# Patient Record
Sex: Male | Born: 1950 | Race: White | Hispanic: No | Marital: Married | Smoking: Never smoker
Health system: Southern US, Community
[De-identification: ages and names within clinical notes are randomized; demographics above are authoritative.]

## PROBLEM LIST (undated history)

## (undated) DIAGNOSIS — C8460 Anaplastic large cell lymphoma, ALK-positive, unspecified site: Secondary | ICD-10-CM

## (undated) DIAGNOSIS — IMO0001 Reserved for inherently not codable concepts without codable children: Secondary | ICD-10-CM

## (undated) DIAGNOSIS — I10 Essential (primary) hypertension: Secondary | ICD-10-CM

## (undated) DIAGNOSIS — N200 Calculus of kidney: Secondary | ICD-10-CM

## (undated) DIAGNOSIS — C8462 Anaplastic large cell lymphoma, ALK-positive, intrathoracic lymph nodes: Principal | ICD-10-CM

## (undated) DIAGNOSIS — K311 Adult hypertrophic pyloric stenosis: Secondary | ICD-10-CM

## (undated) DIAGNOSIS — I4891 Unspecified atrial fibrillation: Secondary | ICD-10-CM

## (undated) DIAGNOSIS — IMO0002 Reserved for concepts with insufficient information to code with codable children: Secondary | ICD-10-CM

## (undated) HISTORY — DX: Reserved for concepts with insufficient information to code with codable children: IMO0002

## (undated) HISTORY — DX: Anaplastic large cell lymphoma, ALK-positive, intrathoracic lymph nodes: C84.62

## (undated) HISTORY — PX: OTHER SURGICAL HISTORY: SHX169

## (undated) HISTORY — PX: HERNIA REPAIR: SHX51

## (undated) HISTORY — DX: Reserved for inherently not codable concepts without codable children: IMO0001

## (undated) HISTORY — PX: CYSTOSCOPY: SHX5120

---

## 2008-04-18 ENCOUNTER — Emergency Department (HOSPITAL_BASED_OUTPATIENT_CLINIC_OR_DEPARTMENT_OTHER): Admission: EM | Admit: 2008-04-18 | Discharge: 2008-04-18 | Payer: Self-pay | Admitting: Emergency Medicine

## 2008-04-18 ENCOUNTER — Ambulatory Visit: Payer: Self-pay | Admitting: Diagnostic Radiology

## 2010-07-28 LAB — DIFFERENTIAL
Basophils Absolute: 0 10*3/uL (ref 0.0–0.1)
Lymphocytes Relative: 28 % (ref 12–46)
Lymphs Abs: 2.1 10*3/uL (ref 0.7–4.0)
Monocytes Absolute: 0.4 10*3/uL (ref 0.1–1.0)
Neutro Abs: 5 10*3/uL (ref 1.7–7.7)

## 2010-07-28 LAB — BASIC METABOLIC PANEL
Calcium: 9.5 mg/dL (ref 8.4–10.5)
GFR calc Af Amer: >60 mL/min (ref >60)
GFR calc non Af Amer: >60 mL/min (ref >60)
Sodium: 136 mEq/L (ref 135–145)

## 2010-07-28 LAB — POCT CARDIAC MARKERS: Myoglobin, poc: 43.9 ng/mL (ref 12–200)

## 2010-07-28 LAB — CBC
Hemoglobin: 16.4 g/dL (ref 13.0–17.0)
RBC: 5.26 MIL/uL (ref 4.22–5.81)
RDW: 12.1 % (ref 11.5–15.5)
WBC: 7.7 10*3/uL (ref 4.0–10.5)

## 2011-01-20 ENCOUNTER — Emergency Department (HOSPITAL_BASED_OUTPATIENT_CLINIC_OR_DEPARTMENT_OTHER)
Admission: EM | Admit: 2011-01-20 | Discharge: 2011-01-20 | Disposition: A | Discharge status: Home / Self Care | Payer: PRIVATE HEALTH INSURANCE | Attending: Emergency Medicine | Admitting: Emergency Medicine

## 2011-01-20 ENCOUNTER — Other Ambulatory Visit: Payer: Self-pay

## 2011-01-20 ENCOUNTER — Encounter: Payer: Self-pay | Admitting: Emergency Medicine

## 2011-01-20 DIAGNOSIS — I10 Essential (primary) hypertension: Secondary | ICD-10-CM | POA: Insufficient documentation

## 2011-01-20 DIAGNOSIS — I4891 Unspecified atrial fibrillation: Secondary | ICD-10-CM | POA: Insufficient documentation

## 2011-01-20 DIAGNOSIS — R0789 Other chest pain: Secondary | ICD-10-CM

## 2011-01-20 DIAGNOSIS — Z79899 Other long term (current) drug therapy: Secondary | ICD-10-CM | POA: Insufficient documentation

## 2011-01-20 HISTORY — DX: Unspecified atrial fibrillation: I48.91

## 2011-01-20 HISTORY — DX: Essential (primary) hypertension: I10

## 2011-01-20 HISTORY — DX: Adult hypertrophic pyloric stenosis: K31.1

## 2011-01-20 HISTORY — DX: Calculus of kidney: N20.0

## 2011-01-20 LAB — DIFFERENTIAL
Eosinophils Relative: 1 % (ref 0–5)
Lymphocytes Relative: 24 % (ref 12–46)
Lymphs Abs: 2 10*3/uL (ref 0.7–4.0)
Monocytes Absolute: 0.4 10*3/uL (ref 0.1–1.0)

## 2011-01-20 LAB — CBC
HCT: 46.7 % (ref 39.0–52.0)
MCV: 85.5 fL (ref 78.0–100.0)
RBC: 5.46 MIL/uL (ref 4.22–5.81)
RDW: 11.9 % (ref 11.5–15.5)
WBC: 8.4 10*3/uL (ref 4.0–10.5)

## 2011-01-20 LAB — BASIC METABOLIC PANEL
BUN: 12 mg/dL (ref 6–23)
CO2: 22 mEq/L (ref 19–32)
Calcium: 9.7 mg/dL (ref 8.4–10.5)
Creatinine, Ser: 0.9 mg/dL (ref 0.50–1.35)
Glucose, Bld: 100 mg/dL — ABNORMAL HIGH (ref 70–99)

## 2011-01-20 MED ORDER — SODIUM CHLORIDE 0.9 % IJ SOLN
3.0000 mL | INTRAMUSCULAR | Status: DC | PRN
  Filled 2011-01-20: qty 3

## 2011-01-20 MED ORDER — OMEPRAZOLE 20 MG PO CPDR: 40.0000 mg | DELAYED_RELEASE_CAPSULE | Freq: Every day | ORAL | Status: DC

## 2011-01-20 NOTE — ED Provider Notes (Signed)
History     CSN: [id number] Arrival date & time: 01/20/2011  7:38 PM  Chief Complaint  Patient presents with  . Hypertension    (Consider location/radiation/quality/duration/timing/severity/associated sxs/prior treatment) HPI Several weeks of intermittent belching, which occurs multiple times per hour for about an hour at a time then symptom free last several hours to a couple days without doing it, his belching is nonexertional and has no chest pain palpitation shortness of breath or other concerns other than his blood pressure mildly elevated higher than usual today. Today he feels some generalized fatigue and generalized weakness but has no pain no lightheadedness no vertigo no change in speech, vision, swallowing, or understanding, and no lateralizing weakness or incoordination or numbness. Past Medical History  Diagnosis Date  . Hypertension   . Atrial fibrillation   . Pyloric stenosis   . Kidney stones     Past Surgical History  Procedure Date  . Cystoscopy   . Pyloric stenosis repair     age 60 yrs    History reviewed. No pertinent family history.  History  Substance Use Topics  . Smoking status: Never Smoker   . Smokeless tobacco: Not on file  . Alcohol Use: 0.6 oz/week    1 Glasses of wine per week      Review of Systems  Constitutional: Positive for fatigue. Negative for fever.       10 Systems reviewed and are negative for acute change except as noted in the HPI.  HENT: Negative for congestion.   Eyes: Negative for discharge and redness.  Respiratory: Negative for cough and shortness of breath.   Cardiovascular: Negative for chest pain.  Gastrointestinal: Negative for vomiting and abdominal pain.  Musculoskeletal: Negative for back pain.  Skin: Negative for rash.  Neurological: Negative for syncope, numbness and headaches.  Psychiatric/Behavioral:       No behavior change.    Allergies  Sulfa drugs cross reactors  Home Medications   Current  Outpatient Rx  Name Route Sig Dispense Refill  . ASPIRIN EC 81 MG PO TBEC Oral Take 81 mg by mouth daily.     . ATENOLOL 25 MG PO TABS Oral Take 12.5 mg by mouth daily.     Marland Kitchen VITAMIN D 1000 UNITS PO TABS Oral Take 2,000 Units by mouth daily.     . OMEGA-3 FATTY ACIDS 1000 MG PO CAPS Oral Take 2 g by mouth daily.     Marland Kitchen GLUCOSAMINE HCL PO Oral Take 1 tablet by mouth daily.     Marland Kitchen MILK THISTLE PO Oral Take 1 tablet by mouth daily.     Marland Kitchen NIACIN CR 500 MG PO TBCR Oral Take 500 mg by mouth daily.     Marland Kitchen NIFEDIPINE ER OSMOTIC 30 MG PO TB24 Oral Take 30 mg by mouth daily.     . CHOLEST OFF PO Oral Take 1 tablet by mouth daily.     . RED YEAST RICE PO Oral Take 2 tablets by mouth daily.     . TESTOSTERONE ENANTHATE 200 MG/ML IM OIL Intramuscular Inject into the muscle every 14 (fourteen) days. Last taken on 9th of October 2012    . OMEPRAZOLE 20 MG PO CPDR Oral Take 40 mg by mouth daily.        BP 145/87  Pulse 70  Temp(Src) 98 F (36.7 C) (Oral)  Resp 12  SpO2 99%  Physical Exam  Nursing note and vitals reviewed. Constitutional:       Awake, alert, nontoxic  appearance.  HENT:  Head: Atraumatic.  Eyes: Right eye exhibits no discharge. Left eye exhibits no discharge.  Neck: Neck supple.  Cardiovascular: Normal rate and regular rhythm.   No murmur heard. Pulmonary/Chest: Effort normal and breath sounds normal. No respiratory distress. He has no wheezes. He has no rales. He exhibits no tenderness.  Abdominal: Soft. Bowel sounds are normal. There is no tenderness. There is no rebound.  Musculoskeletal: He exhibits no edema and no tenderness.       Baseline ROM, no obvious new focal weakness.  Neurological:       Mental status and motor strength appears baseline for patient and situation.  Skin: No rash noted.  Psychiatric: He has a normal mood and affect.    ED Course  Procedures (including critical care time) ECG normal sinus rhythm with sinus arrhythmia, ventricular rate 75 beats per  minute, normal axis, normal intervals, septal Q waves are present, nonspecific ST changes present, compared with January 2010 atrial fibrillation is no longer present.: Labs Reviewed  CBC - Abnormal; Notable for the following:    MCHC 36.4 (*)    All other components within normal limits  BASIC METABOLIC PANEL - Abnormal; Notable for the following:    Sodium 134 (*)    Glucose, Bld 100 (*)    All other components within normal limits  DIFFERENTIAL  TROPONIN I   Dg Chest Portable 1 View  01/27/2011  *RADIOLOGY REPORT*  Clinical Data: Chest pain with tachycardia and shortness of breath. History of hypertension and atrial fibrillation.  PORTABLE CHEST - 1 VIEW  Comparison: 04/18/2008.  Findings: 0933 hours. The heart size and mediastinal contours are normal. The lungs are clear. There is no pleural effusion or pneumothorax. No acute osseous findings are identified.  Telemetry leads overlie the chest.  IMPRESSION: No active pulmonary process.  Original Report Authenticated By: Gerrianne Scale, M.D.     1. Chest pain, atypical     The patient remained stable in the emergency Department he understands and agrees with his assessment and plan as well.  He has not had his symptoms while in the emergency department of belching.  MDM          Hurman Horn, MD 01/27/11 2255

## 2011-01-20 NOTE — ED Notes (Signed)
Pt c/o "jsut not feeling well" with "indigestion today and blood pressure increasing through out the day. Pt has hx of afib.

## 2011-01-27 ENCOUNTER — Other Ambulatory Visit: Payer: Self-pay

## 2011-01-27 ENCOUNTER — Encounter (HOSPITAL_BASED_OUTPATIENT_CLINIC_OR_DEPARTMENT_OTHER): Payer: Self-pay | Admitting: *Deleted

## 2011-01-27 ENCOUNTER — Emergency Department (HOSPITAL_BASED_OUTPATIENT_CLINIC_OR_DEPARTMENT_OTHER)
Admission: EM | Admit: 2011-01-27 | Discharge: 2011-01-27 | Disposition: A | Discharge status: Home / Self Care | Payer: PRIVATE HEALTH INSURANCE | Attending: Emergency Medicine | Admitting: Emergency Medicine

## 2011-01-27 ENCOUNTER — Emergency Department (INDEPENDENT_AMBULATORY_CARE_PROVIDER_SITE_OTHER): Payer: PRIVATE HEALTH INSURANCE

## 2011-01-27 DIAGNOSIS — I4891 Unspecified atrial fibrillation: Secondary | ICD-10-CM | POA: Insufficient documentation

## 2011-01-27 DIAGNOSIS — R000 Tachycardia, unspecified: Secondary | ICD-10-CM | POA: Insufficient documentation

## 2011-01-27 DIAGNOSIS — I10 Essential (primary) hypertension: Secondary | ICD-10-CM

## 2011-01-27 DIAGNOSIS — R0602 Shortness of breath: Secondary | ICD-10-CM

## 2011-01-27 DIAGNOSIS — Z79899 Other long term (current) drug therapy: Secondary | ICD-10-CM | POA: Insufficient documentation

## 2011-01-27 DIAGNOSIS — R079 Chest pain, unspecified: Secondary | ICD-10-CM

## 2011-01-27 DIAGNOSIS — R002 Palpitations: Secondary | ICD-10-CM | POA: Insufficient documentation

## 2011-01-27 LAB — DIFFERENTIAL
Basophils Absolute: 0 10*3/uL (ref 0.0–0.1)
Basophils Relative: 0 % (ref 0–1)
Eosinophils Absolute: 0 10*3/uL (ref 0.0–0.7)
Monocytes Relative: 5 % (ref 3–12)
Neutrophils Relative %: 78 % — ABNORMAL HIGH (ref 43–77)

## 2011-01-27 LAB — CBC
Hemoglobin: 17.8 g/dL — ABNORMAL HIGH (ref 13.0–17.0)
MCH: 31.1 pg (ref 26.0–34.0)
MCHC: 36.6 g/dL — ABNORMAL HIGH (ref 30.0–36.0)
Platelets: 232 10*3/uL (ref 150–400)
RDW: 12 % (ref 11.5–15.5)

## 2011-01-27 LAB — APTT: aPTT: 24 seconds (ref 24–37)

## 2011-01-27 LAB — D-DIMER, QUANTITATIVE: D-Dimer, Quant: 0.35 ug/mL-FEU (ref 0.00–0.48)

## 2011-01-27 LAB — COMPREHENSIVE METABOLIC PANEL
ALT: 19 U/L (ref 0–53)
Albumin: 4.2 g/dL (ref 3.5–5.2)
Calcium: 9.4 mg/dL (ref 8.4–10.5)
GFR calc Af Amer: >90 mL/min (ref >90)
Glucose, Bld: 109 mg/dL — ABNORMAL HIGH (ref 70–99)
Sodium: 135 mEq/L (ref 135–145)
Total Protein: 7.2 g/dL (ref 6.0–8.3)

## 2011-01-27 LAB — PROTIME-INR: Prothrombin Time: 13.6 seconds (ref 11.6–15.2)

## 2011-01-27 MED ORDER — ASPIRIN 81 MG PO CHEW
324.0000 mg | CHEWABLE_TABLET | Freq: Once | ORAL | Status: AC
  Administered 2011-01-27: 324 mg via ORAL
  Filled 2011-01-27: qty 1
  Filled 2011-01-27: qty 3

## 2011-01-27 NOTE — ED Provider Notes (Signed)
History     CSN: [id number] Arrival date & time: 01/27/2011  8:47 AM  Chief Complaint  Patient presents with  . Tachycardia    sob    Patient states sob began about 2 pm. And pulse and bp going up down.  Patient states burping for two weeks.  HR up to 100 during night.  Patient with history of a fib one time 2009 without recurrence.  Patient states some discomfort left upper back states dull feels like sore from exercise.  Noted began during night last night.  Pain comes and goes lasting 45 min to hour.  Present now at 1/10 worse 2-3/10.  Patient seen about a week ago and thought he might have been in a fib.  No a fib and told to try protonix which patient states made symptoms worse after one attempt. (Consider location/radiation/quality/duration/timing/severity/associated sxs/prior treatment) HPI  Past Medical History  Diagnosis Date  . Hypertension   . Atrial fibrillation   . Pyloric stenosis   . Kidney stones     Past Surgical History  Procedure Date  . Cystoscopy   . Pyloric stenosis repair     age 60 yrs    No family history on file.  History  Substance Use Topics  . Smoking status: Never Smoker   . Smokeless tobacco: Not on file  . Alcohol Use: 0.6 oz/week    1 Glasses of wine per week      Review of Systems  All other systems reviewed and are negative.    Allergies  Sulfa drugs cross reactors  Home Medications   Current Outpatient Rx  Name Route Sig Dispense Refill  . ASPIRIN EC 81 MG PO TBEC Oral Take 81 mg by mouth daily.      . ATENOLOL 25 MG PO TABS Oral Take 12.5 mg by mouth daily.      Marland Kitchen VITAMIN D 1000 UNITS PO TABS Oral Take 2,000 Units by mouth daily.      . OMEGA-3 FATTY ACIDS 1000 MG PO CAPS Oral Take 2 g by mouth daily.      Marland Kitchen GLUCOSAMINE HCL PO Oral Take 1 tablet by mouth daily.      Marland Kitchen MILK THISTLE PO Oral Take 1 tablet by mouth daily.      Marland Kitchen NIACIN CR 500 MG PO TBCR Oral Take 500 mg by mouth daily.      Marland Kitchen NIFEDIPINE ER OSMOTIC 30 MG PO  TB24 Oral Take 30 mg by mouth daily.      Marland Kitchen OMEPRAZOLE 20 MG PO CPDR Oral Take 2 capsules (40 mg total) by mouth daily. 20 capsule 0  . CHOLEST OFF PO Oral Take 1 tablet by mouth daily.      . RED YEAST RICE PO Oral Take 2 tablets by mouth daily.      . TESTOSTERONE ENANTHATE 200 MG/ML IM OIL Intramuscular Inject into the muscle every 14 (fourteen) days.        BP 149/99  Pulse 83  Temp(Src) 98.7 F (37.1 C) (Oral)  Resp 18  Ht 5\' 8"  (1.727 m)  Wt 155 lb (70.308 kg)  BMI 23.57 kg/m2  SpO2 100%  Physical Exam  Nursing note and vitals reviewed. Constitutional: He appears well-developed and well-nourished.  HENT:  Head: Normocephalic and atraumatic.  Eyes: Conjunctivae are normal. Pupils are equal, round, and reactive to light.  Neck: Normal range of motion. Neck supple.  Cardiovascular: Normal rate and regular rhythm.   Pulmonary/Chest: Effort normal and breath sounds  normal.  Abdominal: Soft.  Musculoskeletal: Normal range of motion.  Neurological: He is alert.  Skin: Skin is dry.  Psychiatric: He has a normal mood and affect.    ED Course  Procedures (including critical care time)  Labs Reviewed - No data to display No results found.   No diagnosis found.     Date: 01/27/2011  Rate: 75  Rhythm: normal sinus rhythm  QRS Axis: normal  Intervals: normal  ST/T Wave abnormalities: normal  Conduction Disutrbances:none  Narrative Interpretation:   Old EKG Reviewed: changes noted and no q wave noted in v2          Hilario Quarry, MD 01/27/11 1023

## 2011-01-27 NOTE — ED Notes (Signed)
Patient states he was awakened up this am around 0200 and felt "funny" like he was in an A-Fib rhythm.  States he also has had bulching.  Thru out the morning he has developed shortness of breath, rapid heart rate and increased burping.

## 2011-08-09 ENCOUNTER — Emergency Department (HOSPITAL_BASED_OUTPATIENT_CLINIC_OR_DEPARTMENT_OTHER)
Admission: EM | Admit: 2011-08-09 | Discharge: 2011-08-09 | Disposition: A | Discharge status: Home / Self Care | Payer: PRIVATE HEALTH INSURANCE | Attending: Emergency Medicine | Admitting: Emergency Medicine

## 2011-08-09 ENCOUNTER — Emergency Department (INDEPENDENT_AMBULATORY_CARE_PROVIDER_SITE_OTHER): Payer: PRIVATE HEALTH INSURANCE

## 2011-08-09 ENCOUNTER — Encounter (HOSPITAL_BASED_OUTPATIENT_CLINIC_OR_DEPARTMENT_OTHER): Payer: Self-pay | Admitting: Emergency Medicine

## 2011-08-09 DIAGNOSIS — Z79899 Other long term (current) drug therapy: Secondary | ICD-10-CM | POA: Insufficient documentation

## 2011-08-09 DIAGNOSIS — R0789 Other chest pain: Secondary | ICD-10-CM

## 2011-08-09 DIAGNOSIS — R5381 Other malaise: Secondary | ICD-10-CM | POA: Insufficient documentation

## 2011-08-09 DIAGNOSIS — Z87442 Personal history of urinary calculi: Secondary | ICD-10-CM | POA: Insufficient documentation

## 2011-08-09 DIAGNOSIS — I4891 Unspecified atrial fibrillation: Secondary | ICD-10-CM

## 2011-08-09 DIAGNOSIS — R079 Chest pain, unspecified: Secondary | ICD-10-CM | POA: Insufficient documentation

## 2011-08-09 DIAGNOSIS — Z7982 Long term (current) use of aspirin: Secondary | ICD-10-CM | POA: Insufficient documentation

## 2011-08-09 DIAGNOSIS — R0602 Shortness of breath: Secondary | ICD-10-CM | POA: Insufficient documentation

## 2011-08-09 DIAGNOSIS — R002 Palpitations: Secondary | ICD-10-CM | POA: Insufficient documentation

## 2011-08-09 DIAGNOSIS — R000 Tachycardia, unspecified: Secondary | ICD-10-CM | POA: Insufficient documentation

## 2011-08-09 DIAGNOSIS — I10 Essential (primary) hypertension: Secondary | ICD-10-CM | POA: Insufficient documentation

## 2011-08-09 LAB — CBC
MCHC: 36.4 g/dL — ABNORMAL HIGH (ref 30.0–36.0)
Platelets: 200 10*3/uL (ref 150–400)
RDW: 12.3 % (ref 11.5–15.5)

## 2011-08-09 LAB — BASIC METABOLIC PANEL
BUN: 14 mg/dL (ref 6–23)
Creatinine, Ser: 1 mg/dL (ref 0.50–1.35)
GFR calc Af Amer: >90 mL/min (ref >90)
GFR calc non Af Amer: 80 mL/min — ABNORMAL LOW (ref >90)
Potassium: 4.2 mEq/L (ref 3.5–5.1)

## 2011-08-09 LAB — DIFFERENTIAL
Basophils Absolute: 0 10*3/uL (ref 0.0–0.1)
Basophils Relative: 0 % (ref 0–1)
Neutro Abs: 3.3 10*3/uL (ref 1.7–7.7)
Neutrophils Relative %: 65 % (ref 43–77)

## 2011-08-09 MED ORDER — ASPIRIN 325 MG PO TABS
325.0000 mg | ORAL_TABLET | Freq: Every day | ORAL | Status: DC
  Administered 2011-08-09: 325 mg via ORAL
  Filled 2011-08-09: qty 1

## 2011-08-09 MED ORDER — METOPROLOL TARTRATE 50 MG PO TABS
25.0000 mg | ORAL_TABLET | Freq: Once | ORAL | Status: AC
  Administered 2011-08-09: 25 mg via ORAL
  Filled 2011-08-09: qty 1

## 2011-08-09 MED ORDER — METOPROLOL TARTRATE 50 MG PO TABS: 25.0000 mg | ORAL_TABLET | Freq: Two times a day (BID) | ORAL | Status: DC

## 2011-08-09 NOTE — ED Provider Notes (Signed)
History     CSN: [id number]  Arrival date & time 08/09/11  0920   None     Chief Complaint  Patient presents with  . Tachycardia    (Consider location/radiation/quality/duration/timing/severity/associated sxs/prior treatment) HPI Complains of intermittent feeling of heart racing and vague chest discomfort for the past 2 weeks. Maximum heart rate has been  100, which occurred this morning. blood pressure this morning was 170/96. Patient has intermittent episodes of vague chest discomfort dull anterior which last 10 or 15 minutes at a time for the past 2 weeks no treatment prior to coming here. Patient presently with minimal chest discomfort Past Medical History  Diagnosis Date  . Hypertension   . Atrial fibrillation   . Pyloric stenosis   . Kidney stones     Past Surgical History  Procedure Date  . Cystoscopy   . Pyloric stenosis repair     age 24 yrs  . Hernia repair     History reviewed. No pertinent family history.  History  Substance Use Topics  . Smoking status: Never Smoker   . Smokeless tobacco: Not on file  . Alcohol Use: 0.6 oz/week    1 Glasses of wine per week      Review of Systems  Constitutional: Positive for fatigue.  HENT: Negative.   Respiratory: Positive for chest tightness.   Cardiovascular: Positive for palpitations.  Gastrointestinal: Negative.   Musculoskeletal: Negative.   Skin: Negative.   Neurological: Negative.   Hematological: Negative.   Psychiatric/Behavioral: Negative.   All other systems reviewed and are negative.    Allergies  Sulfa drugs cross reactors  Home Medications   Current Outpatient Rx  Name Route Sig Dispense Refill  . ASPIRIN EC 81 MG PO TBEC Oral Take 81 mg by mouth daily.     Marland Kitchen VITAMIN D 1000 UNITS PO TABS Oral Take 2,000 Units by mouth daily.     . OMEGA-3 FATTY ACIDS 1000 MG PO CAPS Oral Take 2 g by mouth daily.     Marland Kitchen GLUCOSAMINE HCL PO Oral Take 1 tablet by mouth daily.     Marland Kitchen MILK THISTLE PO Oral  Take 1 tablet by mouth daily.     Marland Kitchen NIACIN ER 500 MG PO TBCR Oral Take 500 mg by mouth daily.     Marland Kitchen NIFEDIPINE ER OSMOTIC 30 MG PO TB24 Oral Take 30 mg by mouth daily.     Marland Kitchen OMEPRAZOLE 20 MG PO CPDR Oral Take 40 mg by mouth daily.      . CHOLEST OFF PO Oral Take 1 tablet by mouth daily.     . RED YEAST RICE PO Oral Take 2 tablets by mouth daily.     . TESTOSTERONE ENANTHATE 200 MG/ML IM OIL Intramuscular Inject into the muscle every 14 (fourteen) days. Last taken on 9th of October 2012      BP 146/95  Pulse 92  Temp(Src) 97.9 F (36.6 C) (Oral)  Resp 16  Ht 5\' 8"  (1.727 m)  Wt 160 lb (72.576 kg)  BMI 24.33 kg/m2  SpO2 99%  Physical Exam  Nursing note and vitals reviewed. Constitutional: He appears well-developed and well-nourished.  HENT:  Head: Normocephalic and atraumatic.  Eyes: Conjunctivae are normal. Pupils are equal, round, and reactive to light.  Neck: Neck supple. No tracheal deviation present. No thyromegaly present.  Cardiovascular: Normal rate and regular rhythm.   No murmur heard. Pulmonary/Chest: Effort normal and breath sounds normal.  Abdominal: Soft. Bowel sounds are normal. He exhibits  no distension. There is no tenderness.  Musculoskeletal: Normal range of motion. He exhibits no edema and no tenderness.  Neurological: He is alert. Coordination normal.  Skin: Skin is warm and dry. No rash noted.  Psychiatric: He has a normal mood and affect.    ED Course  Procedures (including critical care time)  Date: 08/09/2011  Rate: 90  Rhythm: normal sinus rhythm  QRS Axis: normal  Intervals: normal  ST/T Wave abnormalities: normal  Conduction Disutrbances: none  Narrative Interpretation: unremarkable Unchanged from  01/27/11  11 AM patient asymptomatic states episodes chest discomfort resolved approximately 15 minutes after my initial interview.   Labs Reviewed - No data to display No results found.   No diagnosis found.  1:45 PM patient remains  asymptomatic pain free Results for orders placed during the hospital encounter of 08/09/11  TROPONIN I      Component Value Range   Troponin I <0.30  <0.30 (ng/mL)  BASIC METABOLIC PANEL      Component Value Range   Sodium 136  135 - 145 (mEq/L)   Potassium 4.2  3.5 - 5.1 (mEq/L)   Chloride 101  96 - 112 (mEq/L)   CO2 25  19 - 32 (mEq/L)   Glucose, Bld 109 (*) 70 - 99 (mg/dL)   BUN 14  6 - 23 (mg/dL)   Creatinine, Ser 1.61  0.50 - 1.35 (mg/dL)   Calcium 9.5  8.4 - 09.6 (mg/dL)   GFR calc non Af Amer 80 (*) >90 (mL/min)   GFR calc Af Amer >90  >90 (mL/min)  CBC      Component Value Range   WBC 5.0  4.0 - 10.5 (K/uL)   RBC 5.36  4.22 - 5.81 (MIL/uL)   Hemoglobin 16.9  13.0 - 17.0 (g/dL)   HCT 04.5  40.9 - 81.1 (%)   MCV 86.6  78.0 - 100.0 (fL)   MCH 31.5  26.0 - 34.0 (pg)   MCHC 36.4 (*) 30.0 - 36.0 (g/dL)   RDW 91.4  78.2 - 95.6 (%)   Platelets 200  150 - 400 (K/uL)  DIFFERENTIAL      Component Value Range   Neutrophils Relative 65  43 - 77 (%)   Neutro Abs 3.3  1.7 - 7.7 (K/uL)   Lymphocytes Relative 26  12 - 46 (%)   Lymphs Abs 1.3  0.7 - 4.0 (K/uL)   Monocytes Relative 6  3 - 12 (%)   Monocytes Absolute 0.3  0.1 - 1.0 (K/uL)   Eosinophils Relative 2  0 - 5 (%)   Eosinophils Absolute 0.1  0.0 - 0.7 (K/uL)   Basophils Relative 0  0 - 1 (%)   Basophils Absolute 0.0  0.0 - 0.1 (K/uL)  TROPONIN I      Component Value Range   Troponin I <0.30  <0.30 (ng/mL)   Dg Chest Port 1 View  08/09/2011  *RADIOLOGY REPORT*  Clinical Data: Atrial fibrillation.  Chest tightness.  Shortness of breath.  PORTABLE CHEST - 1 VIEW  Comparison: 01/27/2011.  Findings: Mildly decreased inspiration.  Grossly stable normal sized heart.  Clear lungs.  The lungs are mildly hyperexpanded with stable mildly prominent interstitial markings.  Moderate right inferior glenohumeral degenerative spur formation.  IMPRESSION:   No acute abnormality.  Stable mild changes of COPD.  Original Report Authenticated  By: Darrol Angel, M.D.    MDM  Case discussed with Dr. Thomes Lolling, cardiologist. Hospitalization offered the patient which she declines. Dr. Thomes Lolling  suggest Lopressor 25 mg twice daily he will arrange for patient to have cardiology evaluation as outpatient tomorrow Patient understands that he gets recurrent chest discomfort he should go immediately to [facility] hospital emergency department Diagnosis #1 palpitations #2 chest pain       Doug Sou, MD 08/09/11 1350

## 2011-08-09 NOTE — ED Notes (Signed)
Patient having portable X-ray done

## 2011-08-09 NOTE — Discharge Instructions (Signed)
Dr. Ledell Noss office will call you tomorrow for an appointment time to be scheduled at the office tomorrow. If you have further episodes of chest pain call 911 or go to [facility] emergency department immediately. If you don't hear from Dr. Ledell Noss office by tomorrow at noontime, call to schedule the appointment and state that you're here. Tell office staff that Dr. Ethelda Chick spoke with Dr. Thomes Lolling regarding your care and need for followup

## 2011-08-09 NOTE — ED Notes (Signed)
Paged cardiology on call at [phone] per physician-MT

## 2011-08-09 NOTE — ED Notes (Signed)
Pt states he has been having recurrent episodes of heart racing, lasting approx 15 to 30 minutes, then stopping.  Some chest tightness and very mild SOB while it occurred but none now.  Pt denies feeling it currently and states he feels tired presently.

## 2011-08-29 ENCOUNTER — Emergency Department (HOSPITAL_BASED_OUTPATIENT_CLINIC_OR_DEPARTMENT_OTHER)
Admission: EM | Admit: 2011-08-29 | Discharge: 2011-08-29 | Disposition: A | Discharge status: Home / Self Care | Payer: PRIVATE HEALTH INSURANCE | Attending: Emergency Medicine | Admitting: Emergency Medicine

## 2011-08-29 ENCOUNTER — Encounter (HOSPITAL_BASED_OUTPATIENT_CLINIC_OR_DEPARTMENT_OTHER): Payer: Self-pay | Admitting: Emergency Medicine

## 2011-08-29 DIAGNOSIS — Z87442 Personal history of urinary calculi: Secondary | ICD-10-CM | POA: Insufficient documentation

## 2011-08-29 DIAGNOSIS — Z79899 Other long term (current) drug therapy: Secondary | ICD-10-CM | POA: Insufficient documentation

## 2011-08-29 DIAGNOSIS — L255 Unspecified contact dermatitis due to plants, except food: Secondary | ICD-10-CM | POA: Insufficient documentation

## 2011-08-29 DIAGNOSIS — Z7982 Long term (current) use of aspirin: Secondary | ICD-10-CM | POA: Insufficient documentation

## 2011-08-29 DIAGNOSIS — I4891 Unspecified atrial fibrillation: Secondary | ICD-10-CM | POA: Insufficient documentation

## 2011-08-29 DIAGNOSIS — R51 Headache: Secondary | ICD-10-CM | POA: Insufficient documentation

## 2011-08-29 DIAGNOSIS — I10 Essential (primary) hypertension: Secondary | ICD-10-CM | POA: Insufficient documentation

## 2011-08-29 DIAGNOSIS — T622X1A Toxic effect of other ingested (parts of) plant(s), accidental (unintentional), initial encounter: Secondary | ICD-10-CM | POA: Insufficient documentation

## 2011-08-29 DIAGNOSIS — IMO0001 Reserved for inherently not codable concepts without codable children: Secondary | ICD-10-CM

## 2011-08-29 NOTE — ED Notes (Signed)
Pt c/o elevated BP, HA & "shakiness" (felt shakiness last time he went into afib)

## 2011-08-29 NOTE — Discharge Instructions (Signed)
Hypertension Information As your heart beats, it forces blood through your arteries. This force is your blood pressure. If the pressure is too high, it is called hypertension (HTN) or high blood pressure. HTN is dangerous because you may have it and not know it. High blood pressure may mean that your heart has to work harder to pump blood. Your arteries may be narrow or stiff. The extra work puts you at risk for heart disease, stroke, and other problems.  Blood pressure consists of two numbers, a higher number over a lower, 110/72, for example. It is stated as "110 over 72." The ideal is below 120 for the top number (systolic) and under 80 for the bottom (diastolic).  You should pay close attention to your blood pressure if you have certain conditions such as:  Heart failure.   Prior heart attack.   Diabetes   Chronic kidney disease.   Prior stroke.   Multiple risk factors for heart disease.  To see if you have HTN, your blood pressure should be measured while you are seated with your arm held at the level of the heart. It should be measured at least twice. A one-time elevated blood pressure reading (especially in the Emergency Department) does not mean that you need treatment. There may be conditions in which the blood pressure is different between your right and left arms. It is important to see your caregiver soon for a recheck. Most people have essential hypertension which means that there is not a specific cause. This type of high blood pressure may be lowered by changing lifestyle factors such as:  Stress.   Smoking.   Lack of exercise.   Excessive weight.   Drug/tobacco/alcohol use.   Eating less salt.  Most people do not have symptoms from high blood pressure until it has caused damage to the body. Effective treatment can often prevent, delay or reduce that damage. TREATMENT  Treatment for high blood pressure, when a cause has been identified, is directed at the cause. There  are a large number of medications to treat HTN. These fall into several categories, and your caregiver will help you select the medicines that are best for you. Medications may have side effects. You should review side effects with your caregiver. If your blood pressure stays high after you have made lifestyle changes or started on medicines,   Your medication(s) may need to be changed.   Other problems may need to be addressed.   Be certain you understand your prescriptions, and know how and when to take your medicine.   Be sure to follow up with your caregiver within the time frame advised (usually within two weeks) to have your blood pressure rechecked and to review your medications.   If you are taking more than one medicine to lower your blood pressure, make sure you know how and at what times they should be taken. Taking two medicines at the same time can result in blood pressure that is too low.  Document Released: 06/02/2005 Document Revised: 12/10/2010 Document Reviewed: 06/09/2007 [employer] Patient Information 2012 [employer]. 

## 2011-08-29 NOTE — ED Provider Notes (Signed)
History     CSN: [id number]  Arrival date & time 08/29/11  6213   First MD Initiated Contact with Patient 08/29/11 0740      Chief Complaint  Patient presents with  . Headache  . Hypertension    (Consider location/radiation/quality/duration/timing/severity/associated sxs/prior treatment) HPI Comments: Pt with h/o HTN and atrial fibrillation reports that he has a prodrome of feeling like he is going to be in atrial fib when his BP is high and he starts belching frequently.  He admits to more stress at work where he works in [employer] for his company.  No CP, SOB.  He has had mild diffuse HA's.  He takes BP meds and now flecainide per his cardiologist.  He denies blurred vision, focal numbness or weakness, facial droop, off balance.  No back pain.  No urinary symptoms . Also denies fevers, cough, URI symptoms.  He was concerned because he felt like atrial fib again this AM and his BP was quite elevated at home.  Pt did get into some poison ivy recently and has been using 1% hydrocortisone cream on it with some relief  Patient is a 61 y.o. male presenting with headaches and hypertension. The history is provided by the patient.  Headache  Pertinent negatives include no fever, no shortness of breath, no nausea and no vomiting.  Hypertension Associated symptoms include headaches. Pertinent negatives include no chest pain, no abdominal pain and no shortness of breath.    Past Medical History  Diagnosis Date  . Hypertension   . Atrial fibrillation   . Pyloric stenosis   . Kidney stones     Past Surgical History  Procedure Date  . Cystoscopy   . Pyloric stenosis repair     age 45 yrs  . Hernia repair     History reviewed. No pertinent family history.  History  Substance Use Topics  . Smoking status: Never Smoker   . Smokeless tobacco: Not on file  . Alcohol Use: 0.6 oz/week    1 Glasses of wine per week      Review of Systems  Constitutional: Negative for fever and chills.    HENT: Negative for congestion and rhinorrhea.   Respiratory: Negative for cough and shortness of breath.   Cardiovascular: Negative for chest pain.  Gastrointestinal: Negative for nausea, vomiting, abdominal pain and diarrhea.  Skin: Positive for rash.  Neurological: Positive for headaches.    Allergies  Sulfa drugs cross reactors  Home Medications   Current Outpatient Rx  Name Route Sig Dispense Refill  . ASPIRIN EC 81 MG PO TBEC Oral Take 81 mg by mouth daily.     Marland Kitchen VITAMIN D 1000 UNITS PO TABS Oral Take 2,000 Units by mouth daily.     . OMEGA-3 FATTY ACIDS 1000 MG PO CAPS Oral Take 2 g by mouth daily.     Marland Kitchen FLECAINIDE ACETATE 50 MG PO TABS Oral Take 50 mg by mouth 2 (two) times daily.    Marland Kitchen GLUCOSAMINE HCL PO Oral Take 1 tablet by mouth daily.     Marland Kitchen METOPROLOL TARTRATE 50 MG PO TABS Oral Take 0.5 tablets (25 mg total) by mouth 2 (two) times daily. 14 tablet 0  . MILK THISTLE PO Oral Take 1 tablet by mouth daily.     Marland Kitchen NIACIN ER 500 MG PO TBCR Oral Take 500 mg by mouth daily.     Marland Kitchen NIFEDIPINE ER OSMOTIC 30 MG PO TB24 Oral Take 30 mg by mouth daily.     Marland Kitchen  OMEPRAZOLE 20 MG PO CPDR Oral Take 40 mg by mouth daily.      . CHOLEST OFF PO Oral Take 1 tablet by mouth daily.     . RED YEAST RICE PO Oral Take 2 tablets by mouth daily.     . TESTOSTERONE ENANTHATE 200 MG/ML IM OIL Intramuscular Inject into the muscle every 14 (fourteen) days. Last taken on 9th of October 2012      BP 173/96  Resp 20  SpO2 100%  Physical Exam  Nursing note and vitals reviewed. Constitutional: He appears well-developed and well-nourished.  Non-toxic appearance. He does not have a sickly appearance. He does not appear ill. No distress.  HENT:  Head: Normocephalic.  Eyes: Conjunctivae and EOM are normal.  Cardiovascular: Normal rate, regular rhythm and intact distal pulses.   Pulmonary/Chest: Effort normal. No respiratory distress.  Neurological: He is alert. He has normal strength. No cranial nerve  deficit. Gait normal. GCS eye subscore is 4. GCS verbal subscore is 5. GCS motor subscore is 6.  Skin: Rash noted.     Psychiatric: His mood appears anxious.    ED Course  Procedures (including critical care time)  Labs Reviewed - No data to display No results found.   1. Elevated blood pressure     RA sat is 100% and normal.    ECG at time 0748 shows NSR at rate 93, normal axis, no ST or W ave abns.  No change from ECG on 08/09/11.    MDM  Pt is somewhat anxious, no evidence of end organ damage.  Pt is reassured.  Pt is not in atrial fib.  No evidence of CVA.  Pt has no CP, SOB.  Pt has had ful cardiac work up and had recent eval by his cardiologist showing no CAD.  Pt is a non smoker, no CAD risks.  Pt can be safely discharged to home.          Gavin Pound. Oletta Lamas, MD 08/29/11 [phone]

## 2014-12-03 ENCOUNTER — Telehealth: Payer: Self-pay | Admitting: Hematology & Oncology

## 2014-12-03 NOTE — Telephone Encounter (Signed)
Called pt phone number and phone constantly rings.  No voice mail.

## 2014-12-10 ENCOUNTER — Ambulatory Visit: Payer: Commercial Managed Care - PPO

## 2014-12-10 ENCOUNTER — Other Ambulatory Visit (HOSPITAL_BASED_OUTPATIENT_CLINIC_OR_DEPARTMENT_OTHER): Payer: Commercial Managed Care - PPO

## 2014-12-10 DIAGNOSIS — C8460 Anaplastic large cell lymphoma, ALK-positive, unspecified site: Secondary | ICD-10-CM

## 2014-12-10 LAB — CBC WITH DIFFERENTIAL (CANCER CENTER ONLY)
BASO#: 0 10*3/uL (ref 0.0–0.2)
BASO%: 0.2 % (ref 0.0–2.0)
EOS%: 1.1 % (ref 0.0–7.0)
Eosinophils Absolute: 0.1 10*3/uL (ref 0.0–0.5)
HCT: 36.4 % — ABNORMAL LOW (ref 38.7–49.9)
HGB: 12.2 g/dL — ABNORMAL LOW (ref 13.0–17.1)
LYMPH#: 1.5 10*3/uL (ref 0.9–3.3)
LYMPH%: 16.8 % (ref 14.0–48.0)
MCH: 28.1 pg (ref 28.0–33.4)
MCHC: 33.5 g/dL (ref 32.0–35.9)
MCV: 84 fL (ref 82–98)
MONO#: 0.6 10*3/uL (ref 0.1–0.9)
MONO%: 6.7 % (ref 0.0–13.0)
NEUT#: 6.6 10*3/uL — ABNORMAL HIGH (ref 1.5–6.5)
NEUT%: 75.2 % (ref 40.0–80.0)
PLATELETS: 340 10*3/uL (ref 145–400)
RBC: 4.34 10*6/uL (ref 4.20–5.70)
RDW: 14.7 % (ref 11.1–15.7)
WBC: 8.8 10*3/uL (ref 4.0–10.0)

## 2014-12-10 LAB — CHCC SATELLITE - SMEAR

## 2014-12-11 ENCOUNTER — Other Ambulatory Visit: Payer: Self-pay | Admitting: Nurse Practitioner

## 2014-12-11 ENCOUNTER — Encounter: Payer: Self-pay | Admitting: Hematology & Oncology

## 2014-12-11 ENCOUNTER — Ambulatory Visit (HOSPITAL_BASED_OUTPATIENT_CLINIC_OR_DEPARTMENT_OTHER): Payer: Commercial Managed Care - PPO | Admitting: Hematology & Oncology

## 2014-12-11 VITALS — BP 117/69 | HR 84 | Temp 98.0°F | Resp 18

## 2014-12-11 DIAGNOSIS — C8460 Anaplastic large cell lymphoma, ALK-positive, unspecified site: Secondary | ICD-10-CM

## 2014-12-11 MED ORDER — DIAZEPAM 5 MG PO TABS: 5.0000 mg | ORAL_TABLET | Freq: Once | ORAL | Status: DC

## 2014-12-12 LAB — BETA 2 MICROGLOBULIN, SERUM: BETA 2 MICROGLOBULIN: 1.91 mg/L (ref <=2.51)

## 2014-12-12 LAB — COMPREHENSIVE METABOLIC PANEL
ALK PHOS: 140 U/L — AB (ref 40–115)
ALT: 10 U/L (ref 9–46)
AST: 14 U/L (ref 10–35)
Albumin: 3.8 g/dL (ref 3.6–5.1)
BILIRUBIN TOTAL: 0.3 mg/dL (ref 0.2–1.2)
BUN: 21 mg/dL (ref 7–25)
CO2: 26 mmol/L (ref 20–31)
Calcium: 9.1 mg/dL (ref 8.6–10.3)
Chloride: 98 mmol/L (ref 98–110)
Creatinine, Ser: 0.88 mg/dL (ref 0.70–1.25)
GLUCOSE: 86 mg/dL (ref 65–99)
Potassium: 4.7 mmol/L (ref 3.5–5.3)
Sodium: 134 mmol/L — ABNORMAL LOW (ref 135–146)
TOTAL PROTEIN: 6.5 g/dL (ref 6.1–8.1)

## 2014-12-12 LAB — PROTEIN ELECTROPHORESIS, SERUM, WITH REFLEX
Albumin ELP: 3.3 g/dL — ABNORMAL LOW (ref 3.8–4.8)
Alpha-1-Globulin: 0.4 g/dL — ABNORMAL HIGH (ref 0.2–0.3)
Alpha-2-Globulin: 1.1 g/dL — ABNORMAL HIGH (ref 0.5–0.9)
BETA GLOBULIN: 0.5 g/dL (ref 0.4–0.6)
Beta 2: 0.4 g/dL (ref 0.2–0.5)
Gamma Globulin: 0.8 g/dL (ref 0.8–1.7)
Total Protein, Serum Electrophoresis: 6.5 g/dL (ref 6.1–8.1)

## 2014-12-12 LAB — LACTATE DEHYDROGENASE: LDH: 162 U/L (ref 94–250)

## 2014-12-12 NOTE — Progress Notes (Signed)
Referral MD  Reason for Referral: Anaplastic large cell non-Hodgkin's lymphoma-ALK  positive  No chief complaint on file. : I have lymphoma.  HPI: [name] s a very nice 64 year old white male. He's been in [facility] and he has a past history of atrial fibrillation. He's had some  Kidney stones. He's had pyloric stenosis.  He exercises quite a bit. He works out. He was working out 4 times a week.  He began to have some increasing pain over the right hip area. This came to the point that whenever he try to exercise the next day he can barely walk.  He ultimately was seen by orthopedic surgery. He had some plain x-rays done. Never has had an MRI or CT scan. Apparently, there is some abnormality on the plain x-ray.  He then underwent a biopsy of a right ischial lesion. This is done on august 8. 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  Based on this, he is, referred to the [facility] for an evaluation.  He's had some occasional night sweats. He's had no fever. He's had no rashes. He's had no pruritus. He's had no change in bowel or bladder habits.  He still try to work. 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  His partner  Is with him. They will be married on September 10.  He's having some discomfort in the right hip area. He says Naprosyn works best for this. He also takes some Tylenol.  He's had no cough.  He's not noted any palpable lymph glands.   Overall, his performance status is ECOG 1.    Past Medical History  Diagnosis Date  . Hypertension   . Atrial fibrillation   . Pyloric stenosis   . Kidney stones   :  Past Surgical History  Procedure Laterality Date  . Cystoscopy    . Pyloric stenosis repair      age 64 yrs  . Hernia repair    :   Current outpatient prescriptions:  .  acetaminophen (TYLENOL) 325 MG tablet, Take 650 mg by mouth 4 (four) times daily.,  Disp: , Rfl:  .  co-enzyme Q-10 30 MG capsule, Take 250 mg by mouth daily., Disp: , Rfl:  .  finasteride (PROSCAR) 5 MG tablet, Take 5 mg by mouth daily., Disp: , Rfl:  .  naproxen (NAPROSYN) 500 MG tablet, Take 500 mg by mouth 2 (two) times daily with a meal., Disp: , Rfl:  .  saw palmetto 500 MG capsule, Take 450 mg by mouth daily., Disp: , Rfl:  .  aspirin EC 81 MG tablet, Take 81 mg by mouth., Disp: , Rfl:  .  Cholecalciferol (VITAMIN D-1000 MAX ST) 1000 UNITS tablet, Take 2,000 Units by mouth 3 (three) times daily. , Disp: , Rfl:  .  diazepam (VALIUM) 5 MG tablet, Take 1 tablet (5 mg total) by mouth once. Take 1 tablet 30 min prior to procedure. May repeat x1, Disp: 2 tablet, Rfl: 0 .  flecainide (TAMBOCOR) 50 MG tablet, Take 50 mg by mouth 2 (two) times daily. , Disp: , Rfl:  .  GLUCOSAMINE HCL PO, Take 1 tablet by mouth daily. , Disp: , Rfl:  .  metoprolol (LOPRESSOR) 50 MG tablet, Take 0.5 tablets (25 mg total) by mouth 2 (two) times daily., Disp: 14 tablet, Rfl: 0 .  Milk Thistle 140 MG CAPS, Take 250 mg by mouth daily. , Disp: ,  Rfl:  .  NIFEdipine (PROCARDIA-XL/ADALAT-CC/NIFEDICAL-XL) 30 MG 24 hr tablet, Take 30 mg by mouth., Disp: , Rfl:  .  omeprazole (PRILOSEC) 20 MG capsule, Take 40 mg by mouth daily.  , Disp: , Rfl:  .  Plant Sterols and Stanols (CHOLEST OFF PO), Take 300 mg by mouth daily. , Disp: , Rfl:  .  Red Yeast Rice Extract (RED YEAST RICE PO), Take 600 mg by mouth 2 (two) times daily. , Disp: , Rfl:  .  testosterone enanthate (DELATESTRYL) 200 MG/ML injection, Inject into the muscle every 14 (fourteen) days. 4 ml every 2 weeks, Disp: , Rfl: :  :  Allergies  Allergen Reactions  . Sulfa Antibiotics Hives  . Sulfa Drugs Cross Reactors Itching and Rash  :  History reviewed. No pertinent family history.:  Social History   Social History  . Marital Status: Single    Spouse Name: N/A  . Number of Children: N/A  . Years of Education: N/A   Occupational History  .  Not on file.   Social History Main Topics  . Smoking status: Never Smoker   . Smokeless tobacco: Not on file  . Alcohol Use: 0.6 oz/week    1 Glasses of wine per week  . Drug Use: No  . Sexual Activity: Not on file   Other Topics Concern  . Not on file   Social History Narrative  :  Pertinent items are noted in HPI.  Exam: @[employer] @ Well-developed and well-nourished white male.  Ankle  His vital signs are temperature of 98.  Ulcer 84. Blood pressure 117/69. Weight is 160 pounds. Head and neck exam shows no ocular or oral lesions. No adenopathy is noted in the neck. Lungs are clear. Cardiac exam regular rate and rhythm with no murmurs, rubs or bruits. Abdomen is soft. He has good bowel sounds. There is no fluid wave. There is no palpable liver or spleen tip. Axillary exam shows no bilateral axillary adenopathy. Back exam shows no tenderness over the spine, ribs or hips. Extremities shows no clubbing, cyanosis or edema. He has someslight tenderness to palpation over the right lateral hip area. Skin exam shows no rashes, ecchymoses or petechia. Neurological exam shows no focal neurological deficits.   Recent Labs  12/10/14 1415  WBC 8.8  HGB 12.2*  HCT 36.4*  PLT 340    Recent Labs  12/10/14 1415  NA 134*  K 4.7  CL 98  CO2 26  GLUCOSE 86  BUN 21  CREATININE 0.88  CALCIUM 9.1    Blood smear review:  none  Pathology: see above    Assessment and Plan:  [name] is a very nice 64 year old gentleman gentleman. 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 His anaplastic lymphoma that is ALK positive.   At this point, we have to get the staging studies done. He will need a PET scan. He will also need a bone marrow biopsy.  Chemotherapy will clearly be in the future. I think the question is how much he will need. This will be dictated by his staging studies.  With a history of atrial fibrillation and the fact that he will need Adriamycin-based chemotherapy, I also  want to make sure get a echocardiogram.  I spent a good I will see his partner. I'm happy that they will be married in a couple weeks. This is a daily event for them.  We can certainly initiate therapy after they have their nuptials.  Again, I think  the staging studies will tell us how we have to treat this.   Is possible we may need some radiation therapy in addition to chemotherapy.  I don't think we have to check HIV or hepatitis studies. Urine though he is gay, he has very little risk for HIV or hepatitis.  I answered all their questions. I gave them information sheets about the potential of chemotherapy program that we would use.  Given that this is a T-cell lymphoma, there is no role for Rituxan.  I will plan to get him back after we have all of our studies done. He has good peripheral veins so I don't think we have to get  A Port-A-Cath placed.

## 2014-12-13 ENCOUNTER — Other Ambulatory Visit: Payer: Self-pay | Admitting: *Deleted

## 2014-12-13 ENCOUNTER — Encounter: Payer: Self-pay | Admitting: Hematology & Oncology

## 2014-12-14 ENCOUNTER — Other Ambulatory Visit: Payer: Self-pay | Admitting: Hematology & Oncology

## 2014-12-14 DIAGNOSIS — C8460 Anaplastic large cell lymphoma, ALK-positive, unspecified site: Secondary | ICD-10-CM

## 2014-12-18 ENCOUNTER — Encounter (HOSPITAL_COMMUNITY): Payer: Self-pay

## 2014-12-18 ENCOUNTER — Ambulatory Visit (HOSPITAL_COMMUNITY)
Admission: RE | Admit: 2014-12-18 | Discharge: 2014-12-18 | Disposition: A | Discharge status: Home / Self Care | Payer: Commercial Managed Care - PPO | Source: Ambulatory Visit | Attending: Hematology & Oncology | Admitting: Hematology & Oncology

## 2014-12-18 VITALS — BP 119/74 | HR 77 | Temp 97.8°F | Resp 16 | Ht 67.0 in | Wt 145.0 lb

## 2014-12-18 DIAGNOSIS — C8590 Non-Hodgkin lymphoma, unspecified, unspecified site: Secondary | ICD-10-CM | POA: Insufficient documentation

## 2014-12-18 DIAGNOSIS — C8460 Anaplastic large cell lymphoma, ALK-positive, unspecified site: Secondary | ICD-10-CM | POA: Diagnosis not present

## 2014-12-18 LAB — CBC WITH DIFFERENTIAL/PLATELET
Basophils Absolute: 0 10*3/uL (ref 0.0–0.1)
Basophils Relative: 0 % (ref 0–1)
EOS PCT: 1 % (ref 0–5)
Eosinophils Absolute: 0.1 10*3/uL (ref 0.0–0.7)
HCT: 39.5 % (ref 39.0–52.0)
Hemoglobin: 13 g/dL (ref 13.0–17.0)
LYMPHS ABS: 2 10*3/uL (ref 0.7–4.0)
Lymphocytes Relative: 17 % (ref 12–46)
MCH: 27.7 pg (ref 26.0–34.0)
MCHC: 32.9 g/dL (ref 30.0–36.0)
MCV: 84 fL (ref 78.0–100.0)
MONO ABS: 0.7 10*3/uL (ref 0.1–1.0)
Monocytes Relative: 6 % (ref 3–12)
Neutro Abs: 8.8 10*3/uL — ABNORMAL HIGH (ref 1.7–7.7)
Neutrophils Relative %: 76 % (ref 43–77)
PLATELETS: 390 10*3/uL (ref 150–400)
RBC: 4.7 MIL/uL (ref 4.22–5.81)
RDW: 15 % (ref 11.5–15.5)
WBC: 11.6 10*3/uL — ABNORMAL HIGH (ref 4.0–10.5)

## 2014-12-18 LAB — BONE MARROW EXAM

## 2014-12-18 MED ORDER — MEPERIDINE HCL 50 MG/ML IJ SOLN
50.0000 mg | Freq: Once | INTRAMUSCULAR | Status: DC
  Filled 2014-12-18: qty 1

## 2014-12-18 MED ORDER — MEPERIDINE HCL 25 MG/ML IJ SOLN
INTRAMUSCULAR | Status: AC | PRN
  Administered 2014-12-18: 50 mg via INTRAVENOUS

## 2014-12-18 MED ORDER — SODIUM CHLORIDE 0.9 % IV SOLN
Freq: Once | INTRAVENOUS | Status: AC
  Administered 2014-12-18: 08:00:00 via INTRAVENOUS

## 2014-12-18 MED ORDER — MIDAZOLAM HCL 5 MG/5ML IJ SOLN
INTRAMUSCULAR | Status: AC | PRN
  Administered 2014-12-18: 5 mg via INTRAVENOUS

## 2014-12-18 MED ORDER — MIDAZOLAM HCL 5 MG/ML IJ SOLN
10.0000 mg | Freq: Once | INTRAMUSCULAR | Status: DC
  Filled 2014-12-18: qty 2

## 2014-12-18 NOTE — Discharge Instructions (Signed)
Bone Marrow Aspiration, Bone Marrow Biopsy °Care After °Read the instructions outlined below and refer to this sheet in the next few weeks. These discharge instructions provide you with general information on caring for yourself after you leave the hospital. Your caregiver may also give you specific instructions. While your treatment has been planned according to the most current medical practices available, unavoidable complications occasionally occur. If you have any problems or questions after discharge, call your caregiver. °FINDING OUT THE RESULTS OF YOUR TEST °Not all test results are available during your visit. If your test results are not back during the visit, make an appointment with your caregiver to find out the results. Do not assume everything is normal if you have not heard from your caregiver or the medical facility. It is important for you to follow up on all of your test results.  °HOME CARE INSTRUCTIONS  °You have had sedation and may be sleepy or dizzy. Your thinking may not be as clear as usual. For the next 24 hours: °· Only take over-the-counter or prescription medicines for pain, discomfort, and or fever as directed by your caregiver. °· Do not drink alcohol. °· Do not smoke. °· Do not drive. °· Do not make important legal decisions. °· Do not operate heavy machinery. °· Do not care for small children by yourself. °· Keep your dressing clean and dry. You may replace dressing with a bandage after 24 hours. °· You may take a bath or shower after 24 hours. °· Use an ice pack for 20 minutes every 2 hours while awake for pain as needed. °SEEK MEDICAL CARE IF:  °· There is redness, swelling, or increasing pain at the biopsy site. °· There is pus coming from the biopsy site. °· There is drainage from a biopsy site lasting longer than one day. °· An unexplained oral temperature above 102° F (38.9° C) develops. °SEEK IMMEDIATE MEDICAL CARE IF:  °· You develop a rash. °· You have difficulty  breathing. °· You develop any reaction or side effects to medications given. °Document Released: 10/17/2004 Document Revised: 06/22/2011 Document Reviewed: 03/27/2008 °[employer]® Patient Information ©2015 [employer]. This information is not intended to replace advice given to you by your health care provider. Make sure you discuss any questions you have with your health care provider. °Conscious Sedation, Adult, Care After °Refer to this sheet in the next few weeks. These instructions provide you with information on caring for yourself after your procedure. Your health care provider may also give you more specific instructions. Your treatment has been planned according to current medical practices, but problems sometimes occur. Call your health care provider if you have any problems or questions after your procedure. °WHAT TO EXPECT AFTER THE PROCEDURE  °After your procedure: °· You may feel sleepy, clumsy, and have poor balance for several hours. °· Vomiting may occur if you eat too soon after the procedure. °HOME CARE INSTRUCTIONS °· Do not participate in any activities where you could become injured for at least 24 hours. Do not: °¨ Drive. °¨ Swim. °¨ Ride a bicycle. °¨ Operate heavy machinery. °¨ Cook. °¨ Use power tools. °¨ Climb ladders. °¨ Work from a high place. °· Do not make important decisions or sign legal documents until you are improved. °· If you vomit, drink water, juice, or soup when you can drink without vomiting. Make sure you have little or no nausea before eating solid foods. °· Only take over-the-counter or prescription medicines for pain, discomfort, or fever   as directed by your health care provider.  Make sure you and your family fully understand everything about the medicines given to you, including what side effects may occur.  You should not drink alcohol, take sleeping pills, or take medicines that cause drowsiness for at least 24 hours.  If you smoke, do not smoke without  supervision.  If you are feeling better, you may resume normal activities 24 hours after you were sedated.  Keep all appointments with your health care provider. SEEK MEDICAL CARE IF:  Your skin is pale or bluish in color.  You continue to feel nauseous or vomit.  Your pain is getting worse and is not helped by medicine.  You have bleeding or swelling.  You are still sleepy or feeling clumsy after 24 hours. SEEK IMMEDIATE MEDICAL CARE IF:  You develop a rash.  You have difficulty breathing.  You develop any type of allergic problem.  You have a fever. MAKE SURE YOU:  Understand these instructions.  Will watch your condition.  Will get help right away if you are not doing well or get worse. Document Released: 01/18/2013 Document Reviewed: 01/18/2013 [facility] Patient Information 2015 [place], [place]. This information is not intended to replace advice given to you by your health care provider. Make sure you discuss any questions you have with your health care provider.

## 2014-12-18 NOTE — Procedures (Signed)
Logan Mendoza was brought to the short stay unit at [facility] for a bone marrow biopsy and aspirate. This was done that we can help further stage his new diagnosis of lymphoma.  He had IV placed without difficulty peripherally.  We did the appropriate timeout procedure at 7:58 AM.  His Mallimpati score is 1. His ASA class is 1.  We then placed onto his right side. He received a total of 5 mg of Versed and 50 mg of Demerol for IV sedation.  The left posterior iliac crest region was prepped and draped in sterile fashion. 5 mL of 1% lidocaine was altered on the skin down to the periosteum. I then used a scalpel to make an incision into the skin.  I then use the combination biopsy and aspirate needle and obtained to bone marrow aspirates without difficulty.  I then used the biopsy needle to obtain an excellent bone marrow core biopsy. We obtain one that was 3.5 cm in length.  We then cleaned and dressed the procedure site sterilely.  He tolerated the procedure well. There were no complications. There is no bleeding.  Laurey Arrow

## 2014-12-19 ENCOUNTER — Emergency Department (HOSPITAL_BASED_OUTPATIENT_CLINIC_OR_DEPARTMENT_OTHER)
Admission: EM | Admit: 2014-12-19 | Discharge: 2014-12-19 | Disposition: A | Discharge status: Home / Self Care | Payer: Commercial Managed Care - PPO | Attending: Emergency Medicine | Admitting: Emergency Medicine

## 2014-12-19 ENCOUNTER — Encounter (HOSPITAL_BASED_OUTPATIENT_CLINIC_OR_DEPARTMENT_OTHER): Payer: Self-pay | Admitting: Emergency Medicine

## 2014-12-19 DIAGNOSIS — M549 Dorsalgia, unspecified: Secondary | ICD-10-CM | POA: Insufficient documentation

## 2014-12-19 DIAGNOSIS — Z791 Long term (current) use of non-steroidal anti-inflammatories (NSAID): Secondary | ICD-10-CM | POA: Insufficient documentation

## 2014-12-19 DIAGNOSIS — Z7982 Long term (current) use of aspirin: Secondary | ICD-10-CM | POA: Diagnosis not present

## 2014-12-19 DIAGNOSIS — Z87442 Personal history of urinary calculi: Secondary | ICD-10-CM | POA: Insufficient documentation

## 2014-12-19 DIAGNOSIS — I10 Essential (primary) hypertension: Secondary | ICD-10-CM | POA: Diagnosis not present

## 2014-12-19 DIAGNOSIS — Z79899 Other long term (current) drug therapy: Secondary | ICD-10-CM | POA: Diagnosis not present

## 2014-12-19 DIAGNOSIS — Z8719 Personal history of other diseases of the digestive system: Secondary | ICD-10-CM | POA: Insufficient documentation

## 2014-12-19 DIAGNOSIS — Z8571 Personal history of Hodgkin lymphoma: Secondary | ICD-10-CM | POA: Insufficient documentation

## 2014-12-19 DIAGNOSIS — R000 Tachycardia, unspecified: Secondary | ICD-10-CM | POA: Diagnosis present

## 2014-12-19 DIAGNOSIS — I4891 Unspecified atrial fibrillation: Secondary | ICD-10-CM | POA: Diagnosis not present

## 2014-12-19 HISTORY — DX: Anaplastic large cell lymphoma, ALK-positive, unspecified site: C84.60

## 2014-12-19 LAB — CBC WITH DIFFERENTIAL/PLATELET
BASOS ABS: 0 10*3/uL (ref 0.0–0.1)
BASOS PCT: 0 % (ref 0–1)
EOS ABS: 0.1 10*3/uL (ref 0.0–0.7)
Eosinophils Relative: 1 % (ref 0–5)
HEMATOCRIT: 37.8 % — AB (ref 39.0–52.0)
HEMOGLOBIN: 12.6 g/dL — AB (ref 13.0–17.0)
Lymphocytes Relative: 13 % (ref 12–46)
Lymphs Abs: 1.4 10*3/uL (ref 0.7–4.0)
MCH: 27.5 pg (ref 26.0–34.0)
MCHC: 33.3 g/dL (ref 30.0–36.0)
MCV: 82.5 fL (ref 78.0–100.0)
Monocytes Absolute: 0.8 10*3/uL (ref 0.1–1.0)
Monocytes Relative: 7 % (ref 3–12)
NEUTROS ABS: 8.8 10*3/uL — AB (ref 1.7–7.7)
NEUTROS PCT: 79 % — AB (ref 43–77)
Platelets: 407 10*3/uL — ABNORMAL HIGH (ref 150–400)
RBC: 4.58 MIL/uL (ref 4.22–5.81)
RDW: 15 % (ref 11.5–15.5)
WBC: 11.2 10*3/uL — ABNORMAL HIGH (ref 4.0–10.5)

## 2014-12-19 LAB — BASIC METABOLIC PANEL
ANION GAP: 10 (ref 5–15)
BUN: 18 mg/dL (ref 6–20)
CALCIUM: 9.3 mg/dL (ref 8.9–10.3)
CHLORIDE: 100 mmol/L — AB (ref 101–111)
CO2: 25 mmol/L (ref 22–32)
Creatinine, Ser: 0.77 mg/dL (ref 0.61–1.24)
GFR calc non Af Amer: >60 mL/min (ref >60)
Glucose, Bld: 107 mg/dL — ABNORMAL HIGH (ref 65–99)
Potassium: 4.2 mmol/L (ref 3.5–5.1)
SODIUM: 135 mmol/L (ref 135–145)

## 2014-12-19 LAB — TROPONIN I

## 2014-12-19 MED ORDER — GI COCKTAIL ~~LOC~~
30.0000 mL | Freq: Once | ORAL | Status: AC
  Administered 2014-12-19: 30 mL via ORAL
  Filled 2014-12-19: qty 30

## 2014-12-19 NOTE — ED Notes (Signed)
Pt reports elevated blood pressure and elevated heart rate since yesterday. Pt recent dx of lymphoma. Denies pain but having increased belching.

## 2014-12-19 NOTE — ED Provider Notes (Signed)
CSN: [id number]     Arrival date & time 12/19/14  2102 History  This chart was scribed for Logan Freiberg, MD by Eustaquio Maize, ED Scribe. This patient was seen in room MH03/MH03 and the patient's care was started at 9:47 PM.  Chief Complaint  Patient presents with  . Hypertension  . Tachycardia   Patient is a 64 y.o. male presenting with hypertension. The history is provided by the patient. No language interpreter was used.  Hypertension This is a recurrent problem. The current episode started 6 to 12 hours ago. The problem occurs rarely. Pertinent negatives include no chest pain and no shortness of breath. The symptoms are relieved by medications. Treatments tried: hypertensive medication.     HPI Comments: Logan Mendoza is a 64 y.o. male with hx HTN and atrial fibrillation who presents to the Emergency Department complaining of hypertension and tachycardia that began today around 4 PM (approximately 6 hours ago). Pt reports his blood pressure was 190/100 today and his heart rate has been 100. Pt also complains of a tightness in his back and burping which he states is usual when he is hypertensive. Pt reports similar symptoms yesterday. He took 25 mg Flecainide (1/2 his regular dose) yesterday with mild relief. He did the same today without relief. 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 Pt reports not being able to sleep last night and having a headache as well. Denies chest pain, shortness of breath, or any other associated symptoms.    Past Medical History  Diagnosis Date  . Hypertension   . Atrial fibrillation   . Pyloric stenosis   . Kidney stones   . Anaplastic ALK-positive large cell lymphoma    Past Surgical History  Procedure Laterality Date  . Cystoscopy    . Pyloric stenosis repair      age 40 yrs  . Hernia repair     No family history on file. Social History  Substance Use Topics  . Smoking status: Never Smoker    . Smokeless tobacco: None  . Alcohol Use: 0.6 oz/week    1 Glasses of wine per week    Review of Systems  Respiratory: Negative for shortness of breath.   Cardiovascular: Positive for palpitations. Negative for chest pain.  Musculoskeletal: Positive for back pain (Tightness).  All other systems reviewed and are negative.     Allergies  Sulfa antibiotics and Sulfa drugs cross reactors  Home Medications   Prior to Admission medications   Medication Sig Start Date End Date Taking? Authorizing Provider  acetaminophen (TYLENOL) 325 MG tablet Take 650 mg by mouth 4 (four) times daily.    Historical Provider, MD  aspirin EC 81 MG tablet Take 81 mg by mouth.    Historical Provider, MD  Cholecalciferol (VITAMIN D-1000 MAX ST) 1000 UNITS tablet Take 2,000 Units by mouth 3 (three) times daily.     Historical Provider, MD  co-enzyme Q-10 30 MG capsule Take 200 mg by mouth daily.     Historical Provider, MD  diazepam (VALIUM) 5 MG tablet Take 1 tablet (5 mg total) by mouth once. Take 1 tablet 30 min prior to procedure. May repeat x1 12/11/14   Volanda Napoleon, MD  finasteride (PROSCAR) 5 MG tablet Take 5 mg by mouth daily.    Historical Provider, MD  flecainide (TAMBOCOR) 50 MG tablet Take 50 mg by mouth 2 (two) times daily.     Historical Provider, MD  GLUCOSAMINE HCL PO Take 1 tablet by mouth daily.     Historical Provider, MD  Milk Thistle 140 MG CAPS Take 250 mg by mouth daily.     Historical Provider, MD  naproxen (NAPROSYN) 500 MG tablet Take 500 mg by mouth 2 (two) times daily with a meal.    Historical Provider, MD  NIFEdipine (PROCARDIA-XL/ADALAT-CC/NIFEDICAL-XL) 30 MG 24 hr tablet Take 30 mg by mouth.    Historical Provider, MD  Plant Sterols and Stanols (CHOLEST OFF PO) Take 300 mg by mouth daily.     Historical Provider, MD  Red Yeast Rice Extract (RED YEAST RICE PO) Take 600 mg by mouth 2 (two) times daily.     Historical Provider, MD  saw palmetto 500 MG capsule Take 450 mg by  mouth daily.    Historical Provider, MD  testosterone enanthate (DELATESTRYL) 200 MG/ML injection Inject into the muscle every 14 (fourteen) days. 4 ml every 2 weeks    Historical Provider, MD   Triage Vitals: BP 152/82 mmHg  Pulse 91  Temp(Src) 97.9 F (36.6 C) (Oral)  Resp 18  Ht _0  (1.702 m)  Wt 145 lb (65.772 kg)  BMI 22.71 kg/m2  SpO2 100%   Physical Exam  Constitutional: He is oriented to person, place, and time. He appears well-developed and well-nourished.  HENT:  Head: Normocephalic and atraumatic.  Eyes: Conjunctivae and EOM are normal.  Neck: Normal range of motion. Neck supple.  Cardiovascular: Normal rate, regular rhythm and normal heart sounds.   Pulmonary/Chest: Effort normal and breath sounds normal. No respiratory distress.  Abdominal: He exhibits no distension. There is no tenderness. There is no rebound and no guarding.  Musculoskeletal: Normal range of motion.  Neurological: He is alert and oriented to person, place, and time.  Skin: Skin is warm and dry.  Vitals reviewed.   ED Course  Procedures (including critical care time)  DIAGNOSTIC STUDIES: Oxygen Saturation is 100% on RA, normal by my interpretation.    COORDINATION OF CARE: 9:59 PM-Discussed treatment plan which includes CBC, BMP, Troponin with pt at bedside and pt agreed to plan.   Labs Review Labs Reviewed  CBC WITH DIFFERENTIAL/PLATELET - Abnormal; Notable for the following:    WBC 11.2 (*)    Hemoglobin 12.6 (*)    HCT 37.8 (*)    Platelets 407 (*)    Neutrophils Relative % 79 (*)    Neutro Abs 8.8 (*)    All other components within normal limits  BASIC METABOLIC PANEL - Abnormal; Notable for the following:    Chloride 100 (*)    Glucose, Bld 107 (*)    All other components within normal limits  TROPONIN I    Imaging Review No results found. I have personally reviewed and evaluated these images and lab results as part of my medical decision-making.   EKG  Interpretation   Date/Time:  Wednesday December 19 2014 21:12:39 EDT Ventricular Rate:  90 PR Interval:  160 QRS Duration: 102 QT Interval:  354 QTC Calculation: 433 R Axis:   79 Text Interpretation:  Normal sinus rhythm Nonspecific ST abnormality  Abnormal ECG No significant change since last tracing Confirmed by Logan Mendoza [phone]) on 12/19/2014 9:48:43 PM      MDM   Final diagnoses:  Essential hypertension    64 y.o. male with pertinent PMH of HTN, atrial fibrillation, lymphoma presents with palpitations, sensation of tachycardia. Patient states symptoms are identical to prior episodes of atrial fibrillation. He states he  took an extra dose of his home for tonight per his instructions from M.D. On arrival patient is asymptomatic with normal vital signs with exception of mild hypertension. Exam unremarkable. No chest pain, dyspnea, other concerning findings. Workup as above unremarkable. Discharged home in stable condition to follow-up with PCP and cardiologist..    I have reviewed all laboratory and imaging studies if ordered as above  1. Essential hypertension           Logan Freiberg, MD 12/19/14 2248

## 2014-12-19 NOTE — Discharge Instructions (Signed)
Hypertension °Hypertension, commonly called high blood pressure, is when the force of blood pumping through your arteries is too strong. Your arteries are the blood vessels that carry blood from your heart throughout your body. A blood pressure reading consists of a higher number over a lower number, such as 110/72. The higher number (systolic) is the pressure inside your arteries when your heart pumps. The lower number (diastolic) is the pressure inside your arteries when your heart relaxes. Ideally you want your blood pressure below 120/80. °Hypertension forces your heart to work harder to pump blood. Your arteries may become narrow or stiff. Having hypertension puts you at risk for heart disease, stroke, and other problems.  °RISK FACTORS °Some risk factors for high blood pressure are controllable. Others are not.  °Risk factors you cannot control include:  °· Race. You may be at higher risk if you are African American. °· Age. Risk increases with age. °· Gender. Men are at higher risk than women before age 45 years. After age 65, women are at higher risk than men. °Risk factors you can control include: °· Not getting enough exercise or physical activity. °· Being overweight. °· Getting too much fat, sugar, calories, or salt in your diet. °· Drinking too much alcohol. °SIGNS AND SYMPTOMS °Hypertension does not usually cause signs or symptoms. Extremely high blood pressure (hypertensive crisis) may cause headache, anxiety, shortness of breath, and nosebleed. °DIAGNOSIS  °To check if you have hypertension, your health care provider will measure your blood pressure while you are seated, with your arm held at the level of your heart. It should be measured at least twice using the same arm. Certain conditions can cause a difference in blood pressure between your right and left arms. A blood pressure reading that is higher than normal on one occasion does not mean that you need treatment. If one blood pressure reading  is high, ask your health care provider about having it checked again. °TREATMENT  °Treating high blood pressure includes making lifestyle changes and possibly taking medicine. Living a healthy lifestyle can help lower high blood pressure. You may need to change some of your habits. °Lifestyle changes may include: °· Following the DASH diet. This diet is high in fruits, vegetables, and whole grains. It is low in salt, red meat, and added sugars. °· Getting at least 2½ hours of brisk physical activity every week. °· Losing weight if necessary. °· Not smoking. °· Limiting alcoholic beverages. °· Learning ways to reduce stress. ° If lifestyle changes are not enough to get your blood pressure under control, your health care provider may prescribe medicine. You may need to take more than one. Work closely with your health care provider to understand the risks and benefits. °HOME CARE INSTRUCTIONS °· Have your blood pressure rechecked as directed by your health care provider.   °· Take medicines only as directed by your health care provider. Follow the directions carefully. Blood pressure medicines must be taken as prescribed. The medicine does not work as well when you skip doses. Skipping doses also puts you at risk for problems.   °· Do not smoke.   °· Monitor your blood pressure at home as directed by your health care provider.  °SEEK MEDICAL CARE IF:  °· You think you are having a reaction to medicines taken. °· You have recurrent headaches or feel dizzy. °· You have swelling in your ankles. °· You have trouble with your vision. °SEEK IMMEDIATE MEDICAL CARE IF: °· You develop a severe headache or confusion. °·   You have unusual weakness, numbness, or feel faint.  You have severe chest or abdominal pain.  You vomit repeatedly.  You have trouble breathing. MAKE SURE YOU:   Understand these instructions.  Will watch your condition.  Will get help right away if you are not doing well or get worse. Document  Released: 03/30/2005 Document Revised: 08/14/2013 Document Reviewed: 01/20/2013 [facility] Patient Information 2015 [place], [place]. This information is not intended to replace advice given to you by your health care provider. Make sure you discuss any questions you have with your health care provider. Nonspecific Tachycardia Tachycardia is a faster than normal heartbeat (more than 100 beats per minute). In adults, the heart normally beats between 60 and 100 times a minute. A fast heartbeat may be a normal response to exercise or stress. It does not necessarily mean that something is wrong. However, sometimes when your heart beats too fast it may not be able to pump enough blood to the rest of your body. This can result in chest pain, shortness of breath, dizziness, and even fainting. Nonspecific tachycardia means that the specific cause or pattern of your tachycardia is unknown. CAUSES  Tachycardia may be harmless or it may be due to a more serious underlying cause. Possible causes of tachycardia include:  Exercise or exertion.  Fever.  Pain or injury.  Infection.  Loss of body fluids (dehydration).  Overactive thyroid.  Lack of red blood cells (anemia).  Anxiety and stress.  Alcohol.  Caffeine.  Tobacco products.  Diet pills.  Illegal drugs.  Heart disease. SYMPTOMS  Rapid or irregular heartbeat (palpitations).  Suddenly feeling your heart beating (cardiac awareness).  Dizziness.  Tiredness (fatigue).  Shortness of breath.  Chest pain.  Nausea.  Fainting. DIAGNOSIS  Your caregiver will perform a physical exam and take your medical history. In some cases, a heart specialist (cardiologist) may be consulted. Your caregiver may also order:  Blood tests.  Electrocardiography. This test records the electrical activity of your heart.  A heart monitoring test. TREATMENT  Treatment will depend on the likely cause of your tachycardia. The goal is to treat the  underlying cause of your tachycardia. Treatment methods may include:  Replacement of fluids or blood through an intravenous (IV) tube for moderate to severe dehydration or anemia.  New medicines or changes in your current medicines.  Diet and lifestyle changes.  Treatment for certain infections.  Stress relief or relaxation methods. HOME CARE INSTRUCTIONS   Rest.  Drink enough fluids to keep your urine clear or pale yellow.  Do not smoke.  Avoid:  Caffeine.  Tobacco.  Alcohol.  Chocolate.  Stimulants such as over-the-counter diet pills or pills that help you stay awake.  Situations that cause anxiety or stress.  Illegal drugs such as marijuana, phencyclidine (PCP), and cocaine.  Only take medicine as directed by your caregiver.  Keep all follow-up appointments as directed by your caregiver. SEEK IMMEDIATE MEDICAL CARE IF:   You have pain in your chest, upper arms, jaw, or neck.  You become weak, dizzy, or feel faint.  You have palpitations that will not go away.  You vomit, have diarrhea, or pass blood in your stool.  Your skin is cool, pale, and wet.  You have a fever that will not go away with rest, fluids, and medicine. MAKE SURE YOU:   Understand these instructions.  Will watch your condition.  Will get help right away if you are not doing well or get worse. Document Released: 05/07/2004 Document  Revised: 06/22/2011 Document Reviewed: 03/10/2011 [employer] Patient Information 2015 [place], [place]. This information is not intended to replace advice given to you by your health care provider. Make sure you discuss any questions you have with your health care provider.

## 2014-12-20 ENCOUNTER — Other Ambulatory Visit: Payer: Self-pay | Admitting: Hematology & Oncology

## 2014-12-20 ENCOUNTER — Ambulatory Visit (HOSPITAL_COMMUNITY)
Admission: RE | Admit: 2014-12-20 | Discharge: 2014-12-20 | Disposition: A | Discharge status: Home / Self Care | Payer: Commercial Managed Care - PPO | Source: Ambulatory Visit | Attending: Hematology & Oncology | Admitting: Hematology & Oncology

## 2014-12-20 DIAGNOSIS — Z09 Encounter for follow-up examination after completed treatment for conditions other than malignant neoplasm: Secondary | ICD-10-CM

## 2014-12-20 DIAGNOSIS — Z01818 Encounter for other preprocedural examination: Secondary | ICD-10-CM | POA: Diagnosis present

## 2014-12-20 DIAGNOSIS — C8460 Anaplastic large cell lymphoma, ALK-positive, unspecified site: Secondary | ICD-10-CM

## 2014-12-20 NOTE — Progress Notes (Signed)
  Echocardiogram 2D Echocardiogram has been performed.  [name] 12/20/2014, 10:00 AM

## 2014-12-21 ENCOUNTER — Telehealth: Payer: Self-pay | Admitting: *Deleted

## 2014-12-21 ENCOUNTER — Ambulatory Visit (HOSPITAL_COMMUNITY)
Admission: RE | Admit: 2014-12-21 | Discharge: 2014-12-21 | Disposition: A | Discharge status: Home / Self Care | Payer: Commercial Managed Care - PPO | Source: Ambulatory Visit | Attending: Hematology & Oncology | Admitting: Hematology & Oncology

## 2014-12-21 DIAGNOSIS — C8460 Anaplastic large cell lymphoma, ALK-positive, unspecified site: Secondary | ICD-10-CM

## 2014-12-21 DIAGNOSIS — Z01818 Encounter for other preprocedural examination: Secondary | ICD-10-CM | POA: Diagnosis not present

## 2014-12-21 LAB — GLUCOSE, CAPILLARY: Glucose-Capillary: 93 mg/dL (ref 65–99)

## 2014-12-21 MED ORDER — FLUDEOXYGLUCOSE F - 18 (FDG) INJECTION
7.2000 | Freq: Once | INTRAVENOUS | Status: DC | PRN
  Administered 2014-12-21: 7.2 via INTRAVENOUS
  Filled 2014-12-21: qty 7.2

## 2014-12-21 NOTE — Telephone Encounter (Addendum)
Patient aware of results and message from Dr Marin Olp  ----- Message from Volanda Napoleon, MD sent at 12/20/2014  6:17 PM EDT ----- Please call him and tell him that the echocardiogram showed that his heart function is very good. His heart muscle is pumping like it should. Also tell him to have a great wedding ceremony on Saturday. He is getting married to his partner. Thanks

## 2014-12-24 LAB — TISSUE HYBRIDIZATION (BONE MARROW)-NCBH

## 2014-12-24 LAB — CHROMOSOME ANALYSIS, BONE MARROW

## 2014-12-26 ENCOUNTER — Other Ambulatory Visit: Payer: Self-pay | Admitting: Hematology & Oncology

## 2014-12-26 ENCOUNTER — Encounter: Payer: Self-pay | Admitting: Hematology & Oncology

## 2014-12-26 DIAGNOSIS — C8462 Anaplastic large cell lymphoma, ALK-positive, intrathoracic lymph nodes: Secondary | ICD-10-CM

## 2014-12-26 HISTORY — DX: Anaplastic large cell lymphoma, ALK-positive, intrathoracic lymph nodes: C84.62

## 2014-12-26 MED ORDER — ALLOPURINOL 100 MG PO TABS: 100.0000 mg | ORAL_TABLET | Freq: Every day | ORAL | Status: DC

## 2014-12-26 MED ORDER — FAMCICLOVIR 250 MG PO TABS: 250.0000 mg | ORAL_TABLET | Freq: Every day | ORAL | Status: DC

## 2014-12-27 ENCOUNTER — Telehealth: Payer: Self-pay | Admitting: Hematology & Oncology

## 2014-12-27 ENCOUNTER — Other Ambulatory Visit: Payer: Self-pay | Admitting: Hematology & Oncology

## 2014-12-27 NOTE — Telephone Encounter (Signed)
Left message for patient re ched class at [facility] 9/21 @ 10 am and appointment for 9/27 at [place]. Scheduled mailed. Message to PE re date/time for f/u appointments.

## 2014-12-27 NOTE — Telephone Encounter (Signed)
[employer] PRIOR AUTH APPROVED Auth: [id number] Valid: 01/01/2015 -04/01/2015  Dx: D66.44 (ICD-10-CM) - Anaplastic large cell lymphoma, ALK-positive, intrathoracic lymph nodes   J3489 J2405 J1100 J9070 J9370 J9371 J9370 J9000  ID: [id number]  P: [phone] #2; #2 PHerschel Senegal, Clinical Nurse @ [phone] F: [phone]

## 2014-12-28 ENCOUNTER — Telehealth: Payer: Self-pay | Admitting: Hematology & Oncology

## 2014-12-28 NOTE — Telephone Encounter (Signed)
Patient returned call re appointments. Confirmed 9/214 ched at [facility] and 9/27 chemo at [place]. No repoonse from PE re 10/7 lab/fu. Proceeded with lab/fu/tx for 10/21 at [place] - patient aware. Message to PE informing him patient scheduled for above appointments only.

## 2015-01-01 ENCOUNTER — Encounter (HOSPITAL_COMMUNITY): Payer: Self-pay

## 2015-01-02 ENCOUNTER — Other Ambulatory Visit: Payer: Commercial Managed Care - PPO

## 2015-01-02 ENCOUNTER — Encounter: Payer: Self-pay | Admitting: *Deleted

## 2015-01-07 ENCOUNTER — Other Ambulatory Visit: Payer: Self-pay | Admitting: Nurse Practitioner

## 2015-01-07 DIAGNOSIS — C8462 Anaplastic large cell lymphoma, ALK-positive, intrathoracic lymph nodes: Secondary | ICD-10-CM

## 2015-01-07 MED ORDER — ONDANSETRON HCL 8 MG PO TABS: 8.0000 mg | ORAL_TABLET | Freq: Two times a day (BID) | ORAL | Status: DC

## 2015-01-07 MED ORDER — PROCHLORPERAZINE MALEATE 10 MG PO TABS: 10.0000 mg | ORAL_TABLET | Freq: Four times a day (QID) | ORAL | Status: DC | PRN

## 2015-01-07 MED ORDER — LORAZEPAM 0.5 MG PO TABS: 0.5000 mg | ORAL_TABLET | Freq: Four times a day (QID) | ORAL | Status: DC | PRN

## 2015-01-07 MED ORDER — PREDNISONE 20 MG PO TABS: 60.0000 mg | ORAL_TABLET | Freq: Every day | ORAL | Status: DC

## 2015-01-08 ENCOUNTER — Ambulatory Visit (HOSPITAL_BASED_OUTPATIENT_CLINIC_OR_DEPARTMENT_OTHER): Payer: Commercial Managed Care - PPO

## 2015-01-08 VITALS — BP 124/70 | HR 90 | Temp 98.2°F | Resp 20

## 2015-01-08 DIAGNOSIS — Z5111 Encounter for antineoplastic chemotherapy: Secondary | ICD-10-CM

## 2015-01-08 DIAGNOSIS — C8462 Anaplastic large cell lymphoma, ALK-positive, intrathoracic lymph nodes: Secondary | ICD-10-CM

## 2015-01-08 DIAGNOSIS — C8460 Anaplastic large cell lymphoma, ALK-positive, unspecified site: Secondary | ICD-10-CM

## 2015-01-08 MED ORDER — SODIUM CHLORIDE 0.9 % IV SOLN
750.0000 mg/m2 | Freq: Once | INTRAVENOUS | Status: AC
  Administered 2015-01-08: 1320 mg via INTRAVENOUS
  Filled 2015-01-08: qty 66

## 2015-01-08 MED ORDER — VINCRISTINE SULFATE CHEMO INJECTION 1 MG/ML
2.0000 mg | Freq: Once | INTRAVENOUS | Status: AC
  Administered 2015-01-08: 2 mg via INTRAVENOUS
  Filled 2015-01-08: qty 2

## 2015-01-08 MED ORDER — SODIUM CHLORIDE 0.9 % IV SOLN
Freq: Once | INTRAVENOUS | Status: AC
  Administered 2015-01-08: 14:00:00 via INTRAVENOUS
  Filled 2015-01-08: qty 8

## 2015-01-08 MED ORDER — SODIUM CHLORIDE 0.9 % IV SOLN
Freq: Once | INTRAVENOUS | Status: AC
  Administered 2015-01-08: 13:00:00 via INTRAVENOUS

## 2015-01-08 MED ORDER — DOXORUBICIN HCL CHEMO IV INJECTION 2 MG/ML
50.0000 mg/m2 | Freq: Once | INTRAVENOUS | Status: AC
  Administered 2015-01-08: 88 mg via INTRAVENOUS
  Filled 2015-01-08: qty 44

## 2015-01-08 NOTE — Patient Instructions (Addendum)
[facility] Discharge Instructions for Patients Receiving Chemotherapy  Today you received the following chemotherapy agents Doxorubicin, VinCRstine, Cytoxan  To help prevent nausea and vomiting after your treatment, we encourage you to take your nausea medication   1) Zofran  (Ondansetron)   Take 1 tablet (8 mg total) by mouth 2 (two) times daily. Start the day after chemo for 3 days. Then as needed for nausea or vomiting. - Oral  2)Compazine  (Prochlorperazine)10 mg by mouth every 6 hours as needed for nausea or vomiting.   3)Ativan (Lorazepam)- Take .5 mg tablet by mouth or under tongue every 6 hours for nausea, or for anxiety.     If you develop nausea and vomiting that is not controlled by your nausea medication, call the clinic [phone] If it is after clinic hours your family physician or the after hours number for the clinic or go to the Emergency Department.   BELOW ARE SYMPTOMS THAT SHOULD BE REPORTED IMMEDIATELY:  *FEVER GREATER THAN 100.5 F  *CHILLS WITH OR WITHOUT FEVER  NAUSEA AND VOMITING THAT IS NOT CONTROLLED WITH YOUR NAUSEA MEDICATION  *UNUSUAL SHORTNESS OF BREATH  *UNUSUAL BRUISING OR BLEEDING  TENDERNESS IN MOUTH AND THROAT WITH OR WITHOUT PRESENCE OF ULCERS  *URINARY PROBLEMS  *BOWEL PROBLEMS  UNUSUAL RASH Items with * indicate a potential emergency and should be followed up as soon as possible.  One of the nurses will contact you 24 hours after your treatment. Please let the nurse know about any problems that you may have experienced. Feel free to call the clinic you have any questions or concerns. The clinic phone number is [phone]   I have been informed and understand all the instructions given to me. I know to contact the clinic, my physician, or go to the Emergency Department if any problems should occur. I do not have any questions at this time, but understand that I may call the clinic during office hours   should I have any  questions or need assistance in obtaining follow up care.    __________________________________________  _____________  __________ Signature of Patient or Authorized Representative            Date                   Time    __________________________________________ Nurse's Signature  Doxorubicin injection What is this medicine? DOXORUBICIN (dox oh ROO bi sin) is a chemotherapy drug. It is used to treat many kinds of cancer like Hodgkin's disease, leukemia, non-Hodgkin's lymphoma, neuroblastoma, sarcoma, and Wilms' tumor. It is also used to treat bladder cancer, breast cancer, lung cancer, ovarian cancer, stomach cancer, and thyroid cancer. This medicine may be used for other purposes; ask your health care provider or pharmacist if you have questions. COMMON BRAND NAME(S): Adriamycin, Adriamycin PFS, Adriamycin RDF, Rubex What should I tell my health care provider before I take this medicine? They need to know if you have any of these conditions: -blood disorders -heart disease, recent heart attack -infection (especially a virus infection such as chickenpox, cold sores, or herpes) -irregular heartbeat -liver disease -recent or ongoing radiation therapy -an unusual or allergic reaction to doxorubicin, other chemotherapy agents, other medicines, foods, dyes, or preservatives -pregnant or trying to get pregnant -breast-feeding How should I use this medicine? This drug is given as an infusion into a vein. It is administered in a hospital or clinic by a specially trained health care professional. If you have pain, swelling, burning or  any unusual feeling around the site of your injection, tell your health care professional right away. Talk to your pediatrician regarding the use of this medicine in children. Special care may be needed. Overdosage: If you think you have taken too much of this medicine contact a poison control center or emergency room at once. NOTE: This medicine is only for  you. Do not share this medicine with others. What if I miss a dose? It is important not to miss your dose. Call your doctor or health care professional if you are unable to keep an appointment. What may interact with this medicine? Do not take this medicine with any of the following medications: -cisapride -droperidol -halofantrine -pimozide -zidovudine This medicine may also interact with the following medications: -chloroquine -chlorpromazine -clarithromycin -cyclophosphamide -cyclosporine -erythromycin -medicines for depression, anxiety, or psychotic disturbances -medicines for irregular heart beat like amiodarone, bepridil, dofetilide, encainide, flecainide, propafenone, quinidine -medicines for seizures like ethotoin, fosphenytoin, phenytoin -medicines for nausea, vomiting like dolasetron, ondansetron, palonosetron -medicines to increase blood counts like filgrastim, pegfilgrastim, sargramostim -methadone -methotrexate -pentamidine -progesterone -vaccines -verapamil Talk to your doctor or health care professional before taking any of these medicines: -acetaminophen -aspirin -ibuprofen -ketoprofen -naproxen This list may not describe all possible interactions. Give your health care provider a list of all the medicines, herbs, non-prescription drugs, or dietary supplements you use. Also tell them if you smoke, drink alcohol, or use illegal drugs. Some items may interact with your medicine. What should I watch for while using this medicine? Your condition will be monitored carefully while you are receiving this medicine. You will need important blood work done while you are taking this medicine. This drug may make you feel generally unwell. This is not uncommon, as chemotherapy can affect healthy cells as well as cancer cells. Report any side effects. Continue your course of treatment even though you feel ill unless your doctor tells you to stop. Your urine may turn red for a  few days after your dose. This is not blood. If your urine is dark or brown, call your doctor. In some cases, you may be given additional medicines to help with side effects. Follow all directions for their use. Call your doctor or health care professional for advice if you get a fever, chills or sore throat, or other symptoms of a cold or flu. Do not treat yourself. This drug decreases your body's ability to fight infections. Try to avoid being around people who are sick. This medicine may increase your risk to bruise or bleed. Call your doctor or health care professional if you notice any unusual bleeding. Be careful brushing and flossing your teeth or using a toothpick because you may get an infection or bleed more easily. If you have any dental work done, tell your dentist you are receiving this medicine. Avoid taking products that contain aspirin, acetaminophen, ibuprofen, naproxen, or ketoprofen unless instructed by your doctor. These medicines may hide a fever. Men and women of childbearing age should use effective birth control methods while using taking this medicine. Do not become pregnant while taking this medicine. There is a potential for serious side effects to an unborn child. Talk to your health care professional or pharmacist for more information. Do not breast-feed an infant while taking this medicine. Do not let others touch your urine or other body fluids for 5 days after each treatment with this medicine. Caregivers should wear latex gloves to avoid touching body fluids during this time. There is a maximum amount  of this medicine you should receive throughout your life. The amount depends on the medical condition being treated and your overall health. Your doctor will watch how much of this medicine you receive in your lifetime. Tell your doctor if you have taken this medicine before. What side effects may I notice from receiving this medicine? Side effects that you should report to  your doctor or health care professional as soon as possible: -allergic reactions like skin rash, itching or hives, swelling of the face, lips, or tongue -low blood counts - this medicine may decrease the number of white blood cells, red blood cells and platelets. You may be at increased risk for infections and bleeding. -signs of infection - fever or chills, cough, sore throat, pain or difficulty passing urine -signs of decreased platelets or bleeding - bruising, pinpoint red spots on the skin, black, tarry stools, blood in the urine -signs of decreased red blood cells - unusually weak or tired, fainting spells, lightheadedness -breathing problems -chest pain -fast, irregular heartbeat -mouth sores -nausea, vomiting -pain, swelling, redness at site where injected -pain, tingling, numbness in the hands or feet -swelling of ankles, feet, or hands -unusual bleeding or bruising Side effects that usually do not require medical attention (report to your doctor or health care professional if they continue or are bothersome): -diarrhea -facial flushing -hair loss -loss of appetite -missed menstrual periods -nail discoloration or damage -red or watery eyes -red colored urine -stomach upset This list may not describe all possible side effects. Call your doctor for medical advice about side effects. You may report side effects to FDA at 1-800-FDA-1088. Where should I keep my medicine? This drug is given in a hospital or clinic and will not be stored at home. NOTE: This sheet is a summary. It may not cover all possible information. If you have questions about this medicine, talk to your doctor, pharmacist, or health care provider.  2015, [employer] Standard. (2012-07-26 09:54:34)  Vincristine injection What is this medicine? VINCRISTINE (vin KRIS teen) is a chemotherapy drug. It slows the growth of cancer cells. This medicine is used to treat many types of cancer like Hodgkin's disease,  leukemia, non-Hodgkin's lymphoma, neuroblastoma (brain cancer), rhabdomyosarcoma, and Wilms' tumor. This medicine may be used for other purposes; ask your health care provider or pharmacist if you have questions. COMMON BRAND NAME(S): Oncovin, Vincasar PFS What should I tell my health care provider before I take this medicine? They need to know if you have any of these conditions: -blood disorders -gout -infection (especially chickenpox, cold sores, or herpes) -kidney disease -liver disease -lung disease -nervous system disease like Charcot-Marie-Tooth (CMT) -recent or ongoing radiation therapy -an unusual or allergic reaction to vincristine, other chemotherapy agents, other medicines, foods, dyes, or preservatives -pregnant or trying to get pregnant -breast-feeding How should I use this medicine? This drug is given as an infusion into a vein. It is administered in a hospital or clinic by a specially trained health care professional. If you have pain, swelling, burning, or any unusual feeling around the site of your injection, tell your health care professional right away. Talk to your pediatrician regarding the use of this medicine in children. While this drug may be prescribed for selected conditions, precautions do apply. Overdosage: If you think you have taken too much of this medicine contact a poison control center or emergency room at once. NOTE: This medicine is only for you. Do not share this medicine with others. What if I miss a dose?  It is important not to miss your dose. Call your doctor or health care professional if you are unable to keep an appointment. What may interact with this medicine? Do not take this medicine with any of the following medications: -itraconazole -mibefradil -voriconazole This medicine may also interact with the following medications: -cyclosporine -erythromycin -fluconazole -ketoconazole -medicines for HIV like delavirdine, efavirenz,  nevirapine -medicines for seizures like ethotoin, fosphenotoin, phenytoin -medicines to increase blood counts like filgrastim, pegfilgrastim, sargramostim -other chemotherapy drugs like cisplatin, L-asparaginase, methotrexate, mitomycin, paclitaxel -pegaspargase -vaccines -zalcitabine, ddC Talk to your doctor or health care professional before taking any of these medicines: -acetaminophen -aspirin -ibuprofen -ketoprofen -naproxen This list may not describe all possible interactions. Give your health care provider a list of all the medicines, herbs, non-prescription drugs, or dietary supplements you use. Also tell them if you smoke, drink alcohol, or use illegal drugs. Some items may interact with your medicine. What should I watch for while using this medicine? Your condition will be monitored carefully while you are receiving this medicine. You will need important blood work done while you are taking this medicine. This drug may make you feel generally unwell. This is not uncommon, as chemotherapy can affect healthy cells as well as cancer cells. Report any side effects. Continue your course of treatment even though you feel ill unless your doctor tells you to stop. In some cases, you may be given additional medicines to help with side effects. Follow all directions for their use. Call your doctor or health care professional for advice if you get a fever, chills or sore throat, or other symptoms of a cold or flu. Do not treat yourself. Avoid taking products that contain aspirin, acetaminophen, ibuprofen, naproxen, or ketoprofen unless instructed by your doctor. These medicines may hide a fever. Do not become pregnant while taking this medicine. Women should inform their doctor if they wish to become pregnant or think they might be pregnant. There is a potential for serious side effects to an unborn child. Talk to your health care professional or pharmacist for more information. Do not  breast-feed an infant while taking this medicine. Men may have a lower sperm count while taking this medicine. Talk to your doctor if you plan to father a child. What side effects may I notice from receiving this medicine? Side effects that you should report to your doctor or health care professional as soon as possible: -allergic reactions like skin rash, itching or hives, swelling of the face, lips, or tongue -breathing problems -confusion or changes in emotions or moods -constipation -cough -mouth sores -muscle weakness -nausea and vomiting -pain, swelling, redness or irritation at the injection site -pain, tingling, numbness in the hands or feet -problems with balance, talking, walking -seizures -stomach pain -trouble passing urine or change in the amount of urine Side effects that usually do not require medical attention (report to your doctor or health care professional if they continue or are bothersome): -diarrhea -hair loss -jaw pain -loss of appetite This list may not describe all possible side effects. Call your doctor for medical advice about side effects. You may report side effects to FDA at 1-800-FDA-1088. Where should I keep my medicine? This drug is given in a hospital or clinic and will not be stored at home. NOTE: This sheet is a summary. It may not cover all possible information. If you have questions about this medicine, talk to your doctor, pharmacist, or health care provider.  2015, [employer] Standard. (2007-12-26 17:17:13)  Cyclophosphamide  injection What is this medicine? CYCLOPHOSPHAMIDE (sye kloe FOSS fa mide) is a chemotherapy drug. It slows the growth of cancer cells. This medicine is used to treat many types of cancer like lymphoma, myeloma, leukemia, breast cancer, and ovarian cancer, to name a few. This medicine may be used for other purposes; ask your health care provider or pharmacist if you have questions. COMMON BRAND NAME(S): Cytoxan,  Neosar What should I tell my health care provider before I take this medicine? They need to know if you have any of these conditions: -blood disorders -history of other chemotherapy -infection -kidney disease -liver disease -recent or ongoing radiation therapy -tumors in the bone marrow -an unusual or allergic reaction to cyclophosphamide, other chemotherapy, other medicines, foods, dyes, or preservatives -pregnant or trying to get pregnant -breast-feeding How should I use this medicine? This drug is usually given as an injection into a vein or muscle or by infusion into a vein. It is administered in a hospital or clinic by a specially trained health care professional. Talk to your pediatrician regarding the use of this medicine in children. Special care may be needed. Overdosage: If you think you have taken too much of this medicine contact a poison control center or emergency room at once. NOTE: This medicine is only for you. Do not share this medicine with others. What if I miss a dose? It is important not to miss your dose. Call your doctor or health care professional if you are unable to keep an appointment. What may interact with this medicine? This medicine may interact with the following medications: -amiodarone -amphotericin B -azathioprine -certain antiviral medicines for HIV or AIDS such as protease inhibitors (e.g., indinavir, ritonavir) and zidovudine -certain blood pressure medications such as benazepril, captopril, enalapril, fosinopril, lisinopril, moexipril, monopril, perindopril, quinapril, ramipril, trandolapril -certain cancer medications such as anthracyclines (e.g., daunorubicin, doxorubicin), busulfan, cytarabine, paclitaxel, pentostatin, tamoxifen, trastuzumab -certain diuretics such as chlorothiazide, chlorthalidone, hydrochlorothiazide, indapamide, metolazone -certain medicines that treat or prevent blood clots like warfarin -certain muscle relaxants such as  succinylcholine -cyclosporine -etanercept -indomethacin -medicines to increase blood counts like filgrastim, pegfilgrastim, sargramostim -medicines used as general anesthesia -metronidazole -natalizumab This list may not describe all possible interactions. Give your health care provider a list of all the medicines, herbs, non-prescription drugs, or dietary supplements you use. Also tell them if you smoke, drink alcohol, or use illegal drugs. Some items may interact with your medicine. What should I watch for while using this medicine? Visit your doctor for checks on your progress. This drug may make you feel generally unwell. This is not uncommon, as chemotherapy can affect healthy cells as well as cancer cells. Report any side effects. Continue your course of treatment even though you feel ill unless your doctor tells you to stop. Drink water or other fluids as directed. Urinate often, even at night. In some cases, you may be given additional medicines to help with side effects. Follow all directions for their use. Call your doctor or health care professional for advice if you get a fever, chills or sore throat, or other symptoms of a cold or flu. Do not treat yourself. This drug decreases your body's ability to fight infections. Try to avoid being around people who are sick. This medicine may increase your risk to bruise or bleed. Call your doctor or health care professional if you notice any unusual bleeding. Be careful brushing and flossing your teeth or using a toothpick because you may get an infection or bleed more  easily. If you have any dental work done, tell your dentist you are receiving this medicine. You may get drowsy or dizzy. Do not drive, use machinery, or do anything that needs mental alertness until you know how this medicine affects you. Do not become pregnant while taking this medicine or for 1 year after stopping it. Women should inform their doctor if they wish to become  pregnant or think they might be pregnant. Men should not father a child while taking this medicine and for 4 months after stopping it. There is a potential for serious side effects to an unborn child. Talk to your health care professional or pharmacist for more information. Do not breast-feed an infant while taking this medicine. This medicine may interfere with the ability to have a child. This medicine has caused ovarian failure in some women. This medicine has caused reduced sperm counts in some men. You should talk with your doctor or health care professional if you are concerned about your fertility. If you are going to have surgery, tell your doctor or health care professional that you have taken this medicine. What side effects may I notice from receiving this medicine? Side effects that you should report to your doctor or health care professional as soon as possible: -allergic reactions like skin rash, itching or hives, swelling of the face, lips, or tongue -low blood counts - this medicine may decrease the number of white blood cells, red blood cells and platelets. You may be at increased risk for infections and bleeding. -signs of infection - fever or chills, cough, sore throat, pain or difficulty passing urine -signs of decreased platelets or bleeding - bruising, pinpoint red spots on the skin, black, tarry stools, blood in the urine -signs of decreased red blood cells - unusually weak or tired, fainting spells, lightheadedness -breathing problems -dark urine -dizziness -palpitations -swelling of the ankles, feet, hands -trouble passing urine or change in the amount of urine -weight gain -yellowing of the eyes or skin Side effects that usually do not require medical attention (report to your doctor or health care professional if they continue or are bothersome): -changes in nail or skin color -hair loss -missed menstrual periods -mouth sores -nausea, vomiting This list may not  describe all possible side effects. Call your doctor for medical advice about side effects. You may report side effects to FDA at 1-800-FDA-1088. Where should I keep my medicine? This drug is given in a hospital or clinic and will not be stored at home. NOTE: This sheet is a summary. It may not cover all possible information. If you have questions about this medicine, talk to your doctor, pharmacist, or health care provider.  2015, [employer]. (2012-02-12 16:22:58)

## 2015-01-09 ENCOUNTER — Telehealth: Payer: Self-pay

## 2015-01-14 ENCOUNTER — Telehealth: Payer: Self-pay | Admitting: *Deleted

## 2015-01-14 ENCOUNTER — Ambulatory Visit (HOSPITAL_BASED_OUTPATIENT_CLINIC_OR_DEPARTMENT_OTHER)
Admission: RE | Admit: 2015-01-14 | Discharge: 2015-01-14 | Disposition: A | Discharge status: Home / Self Care | Payer: Commercial Managed Care - PPO | Source: Ambulatory Visit | Attending: Hematology & Oncology | Admitting: Hematology & Oncology

## 2015-01-14 DIAGNOSIS — M898X8 Other specified disorders of bone, other site: Secondary | ICD-10-CM | POA: Diagnosis not present

## 2015-01-14 DIAGNOSIS — M25552 Pain in left hip: Secondary | ICD-10-CM | POA: Insufficient documentation

## 2015-01-14 DIAGNOSIS — Z8572 Personal history of non-Hodgkin lymphomas: Secondary | ICD-10-CM | POA: Diagnosis not present

## 2015-01-14 DIAGNOSIS — R531 Weakness: Secondary | ICD-10-CM | POA: Insufficient documentation

## 2015-01-14 DIAGNOSIS — C8462 Anaplastic large cell lymphoma, ALK-positive, intrathoracic lymph nodes: Secondary | ICD-10-CM

## 2015-01-14 NOTE — Telephone Encounter (Signed)
Patient c/o left hip pain. He states before his chemo, his right hip hurt. This pain is mostly resolved. His left hip started to hurt a few days ago, and have also become weak. The patient is now using a cane to ambulate. 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 Spoke to Dr Marin Olp who would like patient to have a plain film of this area to ensure there is no acute injury. Patient is aware and will come in today for an x-ray.

## 2015-01-16 ENCOUNTER — Telehealth: Payer: Self-pay | Admitting: *Deleted

## 2015-01-16 NOTE — Telephone Encounter (Signed)
Patient woke up this morning and experienced a nose bleed for about 10 minutes. He has no other signs of bleeding. Dr Marin Olp wants him to apply vaseline inside his nares and observe for any other signs of bleeding. If he does notice bleeding he is to call the office back. He understands.  Also gave him results of the hip xray completed yesterday.

## 2015-02-01 ENCOUNTER — Other Ambulatory Visit (HOSPITAL_BASED_OUTPATIENT_CLINIC_OR_DEPARTMENT_OTHER): Payer: Commercial Managed Care - PPO

## 2015-02-01 ENCOUNTER — Encounter: Payer: Self-pay | Admitting: Hematology & Oncology

## 2015-02-01 ENCOUNTER — Ambulatory Visit (HOSPITAL_BASED_OUTPATIENT_CLINIC_OR_DEPARTMENT_OTHER): Payer: Commercial Managed Care - PPO | Admitting: Hematology & Oncology

## 2015-02-01 ENCOUNTER — Ambulatory Visit (HOSPITAL_BASED_OUTPATIENT_CLINIC_OR_DEPARTMENT_OTHER): Payer: Commercial Managed Care - PPO

## 2015-02-01 VITALS — BP 124/82 | HR 92 | Temp 98.1°F | Resp 16 | Ht 67.0 in | Wt 145.0 lb

## 2015-02-01 DIAGNOSIS — M899 Disorder of bone, unspecified: Secondary | ICD-10-CM | POA: Diagnosis not present

## 2015-02-01 DIAGNOSIS — C8462 Anaplastic large cell lymphoma, ALK-positive, intrathoracic lymph nodes: Secondary | ICD-10-CM

## 2015-02-01 DIAGNOSIS — Z5111 Encounter for antineoplastic chemotherapy: Secondary | ICD-10-CM | POA: Diagnosis not present

## 2015-02-01 DIAGNOSIS — C8460 Anaplastic large cell lymphoma, ALK-positive, unspecified site: Secondary | ICD-10-CM

## 2015-02-01 LAB — CMP (CANCER CENTER ONLY)
ALK PHOS: 183 U/L — AB (ref 26–84)
ALT: 26 U/L (ref 10–47)
AST: 26 U/L (ref 11–38)
Albumin: 3.2 g/dL — ABNORMAL LOW (ref 3.3–5.5)
BUN: 12 mg/dL (ref 7–22)
CO2: 26 mEq/L (ref 18–33)
CREATININE: 0.8 mg/dL (ref 0.6–1.2)
Calcium: 9.4 mg/dL (ref 8.0–10.3)
Chloride: 103 mEq/L (ref 98–108)
GLUCOSE: 128 mg/dL — AB (ref 73–118)
POTASSIUM: 4.1 meq/L (ref 3.3–4.7)
SODIUM: 136 meq/L (ref 128–145)
TOTAL PROTEIN: 6.8 g/dL (ref 6.4–8.1)
Total Bilirubin: 0.4 mg/dl (ref 0.20–1.60)

## 2015-02-01 LAB — CBC WITH DIFFERENTIAL (CANCER CENTER ONLY)
BASO#: 0 10*3/uL (ref 0.0–0.2)
BASO%: 0.5 % (ref 0.0–2.0)
EOS%: 1.1 % (ref 0.0–7.0)
Eosinophils Absolute: 0.1 10*3/uL (ref 0.0–0.5)
HCT: 38.9 % (ref 38.7–49.9)
HGB: 12.7 g/dL — ABNORMAL LOW (ref 13.0–17.1)
LYMPH#: 0.9 10*3/uL (ref 0.9–3.3)
LYMPH%: 16.2 % (ref 14.0–48.0)
MCH: 27.4 pg — ABNORMAL LOW (ref 28.0–33.4)
MCHC: 32.6 g/dL (ref 32.0–35.9)
MCV: 84 fL (ref 82–98)
MONO#: 0.4 10*3/uL (ref 0.1–0.9)
MONO%: 7.7 % (ref 0.0–13.0)
NEUT#: 4.1 10*3/uL (ref 1.5–6.5)
NEUT%: 74.5 % (ref 40.0–80.0)
PLATELETS: 347 10*3/uL (ref 145–400)
RBC: 4.63 10*6/uL (ref 4.20–5.70)
RDW: 15.5 % (ref 11.1–15.7)
WBC: 5.6 10*3/uL (ref 4.0–10.0)

## 2015-02-01 LAB — LACTATE DEHYDROGENASE: LDH: 129 U/L (ref 94–250)

## 2015-02-01 MED ORDER — VINCRISTINE SULFATE CHEMO INJECTION 1 MG/ML
2.0000 mg | Freq: Once | INTRAVENOUS | Status: AC
  Administered 2015-02-01: 2 mg via INTRAVENOUS
  Filled 2015-02-01: qty 2

## 2015-02-01 MED ORDER — SODIUM CHLORIDE 0.9 % IV SOLN
750.0000 mg/m2 | Freq: Once | INTRAVENOUS | Status: AC
  Administered 2015-02-01: 1320 mg via INTRAVENOUS
  Filled 2015-02-01: qty 66

## 2015-02-01 MED ORDER — SODIUM CHLORIDE 0.9 % IV SOLN
Freq: Once | INTRAVENOUS | Status: AC
  Administered 2015-02-01: 10:00:00 via INTRAVENOUS

## 2015-02-01 MED ORDER — DOXORUBICIN HCL CHEMO IV INJECTION 2 MG/ML
50.0000 mg/m2 | Freq: Once | INTRAVENOUS | Status: AC
  Administered 2015-02-01: 88 mg via INTRAVENOUS
  Filled 2015-02-01: qty 44

## 2015-02-01 MED ORDER — ZOLEDRONIC ACID 4 MG/100ML IV SOLN
4.0000 mg | Freq: Once | INTRAVENOUS | Status: AC
Start: 2015-02-01 — End: 2015-02-01
  Administered 2015-02-01: 4 mg via INTRAVENOUS
  Filled 2015-02-01: qty 100

## 2015-02-01 MED ORDER — SODIUM CHLORIDE 0.9 % IV SOLN
Freq: Once | INTRAVENOUS | Status: AC
  Administered 2015-02-01: 11:00:00 via INTRAVENOUS
  Filled 2015-02-01: qty 8

## 2015-02-01 NOTE — Progress Notes (Signed)
Hematology and Oncology Follow Up Visit  Logan Mendoza [id number] December 20, 1950 64 y.o. 02/01/2015   Principle Diagnosis:   Anaplastic large cell non-Hodgkin's lymphoma-ALK (+)  Current Therapy:    Status post cycle 1 of CHOP  Zometa 4 mg IV every month     Interim History:  Logan Mendoza is back for follow-up. I first saw him back in late August. The point time, we went ahead and did our workup for his lymphoma. Again, his lymphoma is ALK positive.  He tolerated his first cycle of CHOP very well. We started him on this after he has partner got married.  He does not complain much in way of pain. He's having a lot of pain in the right hip but now this is gone. After treatment, he has some pain in the left hip. This may be from the Neulasta that he got.  He is working. If he is traveling with his partner. 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  He is still working.  He's had no mouth sores.  Has a little bit of a dry cough.  Overall, his performance status is ECOG 1.  Medications:  Current outpatient prescriptions:  .  allopurinol (ZYLOPRIM) 100 MG tablet, Take 1 tablet (100 mg total) by mouth daily., Disp: 30 tablet, Rfl: 0 .  aspirin EC 81 MG tablet, Take 81 mg by mouth., Disp: , Rfl:  .  Cholecalciferol (VITAMIN D-1000 MAX ST) 1000 UNITS tablet, Take 2,000 Units by mouth 3 (three) times daily. , Disp: , Rfl:  .  co-enzyme Q-10 30 MG capsule, Take 200 mg by mouth daily. , Disp: , Rfl:  .  famciclovir (FAMVIR) 250 MG tablet, Take 1 tablet (250 mg total) by mouth daily., Disp: 30 tablet, Rfl: 8 .  finasteride (PROSCAR) 5 MG tablet, Take 5 mg by mouth daily., Disp: , Rfl:  .  flecainide (TAMBOCOR) 50 MG tablet, Take 50 mg by mouth 2 (two) times daily. , Disp: , Rfl:  .  GLUCOSAMINE HCL PO, Take 1 tablet by mouth daily. , Disp: , Rfl:  .  LORazepam (ATIVAN) 0.5 MG tablet, Take 1 tablet (0.5 mg total) by mouth every 6 (six) hours as needed (Nausea or  vomiting)., Disp: 30 tablet, Rfl: 0 .  Milk Thistle 140 MG CAPS, Take 250 mg by mouth daily. , Disp: , Rfl:  .  NIFEdipine (PROCARDIA-XL/ADALAT-CC/NIFEDICAL-XL) 30 MG 24 hr tablet, Take 30 mg by mouth., Disp: , Rfl:  .  ondansetron (ZOFRAN) 8 MG tablet, Take 1 tablet (8 mg total) by mouth 2 (two) times daily. Start the day after chemo for 3 days. Then as needed for nausea or vomiting., Disp: 30 tablet, Rfl: 1 .  Plant Sterols and Stanols (CHOLEST OFF PO), Take 300 mg by mouth daily. , Disp: , Rfl:  .  predniSONE (DELTASONE) 20 MG tablet, Take 3 tablets (60 mg total) by mouth daily with breakfast. Take on days 1-5 of chemotherapy., Disp: 120 tablet, Rfl: 3 .  prochlorperazine (COMPAZINE) 10 MG tablet, Take 1 tablet (10 mg total) by mouth every 6 (six) hours as needed (Nausea or vomiting)., Disp: 30 tablet, Rfl: 6 .  Red Yeast Rice Extract (RED YEAST RICE PO), Take 600 mg by mouth 2 (two) times daily. , Disp: , Rfl:  .  saw palmetto 500 MG capsule, Take 450 mg by mouth daily., Disp: , Rfl:  .  testosterone enanthate (DELATESTRYL) 200 MG/ML injection, Inject into the muscle every  14 (fourteen) days. 4 ml every 2 weeks, Disp: , Rfl:  No current facility-administered medications for this visit.  Facility-Administered Medications Ordered in Other Visits:  .  0.9 %  sodium chloride infusion, , Intravenous, Once, Volanda Napoleon, MD .  cyclophosphamide (CYTOXAN) 1,320 mg in sodium chloride 0.9 % 250 mL chemo infusion, 750 mg/m2 (Treatment Plan Actual), Intravenous, Once, Volanda Napoleon, MD .  DOXOrubicin (ADRIAMYCIN) chemo injection 88 mg, 50 mg/m2 (Treatment Plan Actual), Intravenous, Once, Volanda Napoleon, MD .  ondansetron (ZOFRAN) 16 mg, dexamethasone (DECADRON) 20 mg in sodium chloride 0.9 % 50 mL IVPB, , Intravenous, Once, Volanda Napoleon, MD .  vinCRIStine (ONCOVIN) 2 mg in sodium chloride 0.9 % 50 mL chemo infusion, 2 mg, Intravenous, Once, Volanda Napoleon, MD .  zolendronic acid (ZOMETA) 4 mg in  sodium chloride 0.9 % 100 mL IVPB, 4 mg, Intravenous, Once, Volanda Napoleon, MD  Allergies:  Allergies  Allergen Reactions  . Sulfa Antibiotics Hives  . Sulfa Drugs Cross Reactors Itching and Rash    Past Medical History, Surgical history, Social history, and Family History were reviewed and updated.  Review of Systems: As above  Physical Exam:  height is _0  (1.702 m) and weight is 145 lb (65.772 kg). His oral temperature is 98.1 F (36.7 C). His blood pressure is 124/82 and his pulse is 92. His respiration is 16.   Wt Readings from Last 3 Encounters:  02/01/15 145 lb (65.772 kg)  12/19/14 145 lb (65.772 kg)  12/18/14 145 lb (65.772 kg)     Well-developed and well-nourished white gentleman. He has outpatient. He has no ocular lesions. There is no scleral icterus. He has no oral mucositis. There is no adenopathy in the neck. Lungs are clear bilaterally. Cardiac exam regular rate and rhythm with no murmurs, rubs or bruits. Abdomen is soft. Has good bowel sounds. There is no fluid wave. There is no palpable liver or spleen tip. Back exam shows no tenderness over the spine, ribs or hips. External shows no clubbing, cyanosis or edema. He has good range motion of his joints. There is no tenderness over the hips bilaterally. Skin exam shows no rashes, ecchymoses or petechia. Neurological exam shows no focal neurological deficits.  Lab Results  Component Value Date   WBC 5.6 02/01/2015   HGB 12.7* 02/01/2015   HCT 38.9 02/01/2015   MCV 84 02/01/2015   PLT 347 02/01/2015     Chemistry      Component Value Date/Time   NA 136 02/01/2015 0904   NA 135 12/19/2014 2151   K 4.1 02/01/2015 0904   K 4.2 12/19/2014 2151   CL 103 02/01/2015 0904   CL 100* 12/19/2014 2151   CO2 26 02/01/2015 0904   CO2 25 12/19/2014 2151   BUN 12 02/01/2015 0904   BUN 18 12/19/2014 2151   CREATININE 0.8 02/01/2015 0904   CREATININE 0.77 12/19/2014 2151      Component Value Date/Time   CALCIUM 9.4  02/01/2015 0904   CALCIUM 9.3 12/19/2014 2151   ALKPHOS 183* 02/01/2015 0904   ALKPHOS 140* 12/10/2014 1415   AST 26 02/01/2015 0904   AST 14 12/10/2014 1415   ALT 26 02/01/2015 0904   ALT 10 12/10/2014 1415   BILITOT 0.40 02/01/2015 0904   BILITOT 0.3 12/10/2014 1415         Impression and Plan: Logan Mendoza is a 64 year old white male. He has anaplastic large cell non-Hodgkin's lymphoma. He has  a lymphoma that is ALK positive.  He is clinically better after 1 cycle of CHOP.  We will go ahead with his second cycle of CHOP. I will then plan for a follow-up PET scan. Hopefully, we will find that most of the activity is gone.  I suspect that he will need either 6-8 cycles of therapy. I think this will be dependent upon what the next PET scan shows.  I'll also would be in favor of radiation therapy to areas that were symptomatic on presentation.  I'm glad that he and his partner were able to get married. They really are a good couple. His partner is a real good man in really watches out for Mr. Hodo.  I will plan to see him back in 3 weeks. We will get the PET scan right before I see him back.   Volanda Napoleon, MD 10/21/201610:22 AM

## 2015-02-01 NOTE — Patient Instructions (Signed)
[facility] Discharge Instructions for Patients Receiving Chemotherapy  Today you received the following chemotherapy agents Doxorubicin, VinCRstine, Cytoxan  To help prevent nausea and vomiting after your treatment, we encourage you to take your nausea medication   1) Zofran  (Ondansetron)   Take 1 tablet (8 mg total) by mouth 2 (two) times daily. Start the day after chemo for 3 days. Then as needed for nausea or vomiting. - Oral  2)Compazine  (Prochlorperazine)10 mg by mouth every 6 hours as needed for nausea or vomiting.   3)Ativan (Lorazepam)- Take .5 mg tablet by mouth or under tongue every 6 hours for nausea, or for anxiety.     If you develop nausea and vomiting that is not controlled by your nausea medication, call the clinic [phone] If it is after clinic hours your family physician or the after hours number for the clinic or go to the Emergency Department.   BELOW ARE SYMPTOMS THAT SHOULD BE REPORTED IMMEDIATELY:  *FEVER GREATER THAN 100.5 F  *CHILLS WITH OR WITHOUT FEVER  NAUSEA AND VOMITING THAT IS NOT CONTROLLED WITH YOUR NAUSEA MEDICATION  *UNUSUAL SHORTNESS OF BREATH  *UNUSUAL BRUISING OR BLEEDING  TENDERNESS IN MOUTH AND THROAT WITH OR WITHOUT PRESENCE OF ULCERS  *URINARY PROBLEMS  *BOWEL PROBLEMS  UNUSUAL RASH Items with * indicate a potential emergency and should be followed up as soon as possible.  One of the nurses will contact you 24 hours after your treatment. Please let the nurse know about any problems that you may have experienced. Feel free to call the clinic you have any questions or concerns. The clinic phone number is [phone]   I have been informed and understand all the instructions given to me. I know to contact the clinic, my physician, or go to the Emergency Department if any problems should occur. I do not have any questions at this time, but understand that I may call the clinic during office hours   should I have any  questions or need assistance in obtaining follow up care.    __________________________________________  _____________  __________ Signature of Patient or Authorized Representative            Date                   Time    __________________________________________ Nurse's Signature  Doxorubicin injection What is this medicine? DOXORUBICIN (dox oh ROO bi sin) is a chemotherapy drug. It is used to treat many kinds of cancer like Hodgkin's disease, leukemia, non-Hodgkin's lymphoma, neuroblastoma, sarcoma, and Wilms' tumor. It is also used to treat bladder cancer, breast cancer, lung cancer, ovarian cancer, stomach cancer, and thyroid cancer. This medicine may be used for other purposes; ask your health care provider or pharmacist if you have questions. COMMON BRAND NAME(S): Adriamycin, Adriamycin PFS, Adriamycin RDF, Rubex What should I tell my health care provider before I take this medicine? They need to know if you have any of these conditions: -blood disorders -heart disease, recent heart attack -infection (especially a virus infection such as chickenpox, cold sores, or herpes) -irregular heartbeat -liver disease -recent or ongoing radiation therapy -an unusual or allergic reaction to doxorubicin, other chemotherapy agents, other medicines, foods, dyes, or preservatives -pregnant or trying to get pregnant -breast-feeding How should I use this medicine? This drug is given as an infusion into a vein. It is administered in a hospital or clinic by a specially trained health care professional. If you have pain, swelling, burning or  any unusual feeling around the site of your injection, tell your health care professional right away. Talk to your pediatrician regarding the use of this medicine in children. Special care may be needed. Overdosage: If you think you have taken too much of this medicine contact a poison control center or emergency room at once. NOTE: This medicine is only for  you. Do not share this medicine with others. What if I miss a dose? It is important not to miss your dose. Call your doctor or health care professional if you are unable to keep an appointment. What may interact with this medicine? Do not take this medicine with any of the following medications: -cisapride -droperidol -halofantrine -pimozide -zidovudine This medicine may also interact with the following medications: -chloroquine -chlorpromazine -clarithromycin -cyclophosphamide -cyclosporine -erythromycin -medicines for depression, anxiety, or psychotic disturbances -medicines for irregular heart beat like amiodarone, bepridil, dofetilide, encainide, flecainide, propafenone, quinidine -medicines for seizures like ethotoin, fosphenytoin, phenytoin -medicines for nausea, vomiting like dolasetron, ondansetron, palonosetron -medicines to increase blood counts like filgrastim, pegfilgrastim, sargramostim -methadone -methotrexate -pentamidine -progesterone -vaccines -verapamil Talk to your doctor or health care professional before taking any of these medicines: -acetaminophen -aspirin -ibuprofen -ketoprofen -naproxen This list may not describe all possible interactions. Give your health care provider a list of all the medicines, herbs, non-prescription drugs, or dietary supplements you use. Also tell them if you smoke, drink alcohol, or use illegal drugs. Some items may interact with your medicine. What should I watch for while using this medicine? Your condition will be monitored carefully while you are receiving this medicine. You will need important blood work done while you are taking this medicine. This drug may make you feel generally unwell. This is not uncommon, as chemotherapy can affect healthy cells as well as cancer cells. Report any side effects. Continue your course of treatment even though you feel ill unless your doctor tells you to stop. Your urine may turn red for a  few days after your dose. This is not blood. If your urine is dark or brown, call your doctor. In some cases, you may be given additional medicines to help with side effects. Follow all directions for their use. Call your doctor or health care professional for advice if you get a fever, chills or sore throat, or other symptoms of a cold or flu. Do not treat yourself. This drug decreases your body's ability to fight infections. Try to avoid being around people who are sick. This medicine may increase your risk to bruise or bleed. Call your doctor or health care professional if you notice any unusual bleeding. Be careful brushing and flossing your teeth or using a toothpick because you may get an infection or bleed more easily. If you have any dental work done, tell your dentist you are receiving this medicine. Avoid taking products that contain aspirin, acetaminophen, ibuprofen, naproxen, or ketoprofen unless instructed by your doctor. These medicines may hide a fever. Men and women of childbearing age should use effective birth control methods while using taking this medicine. Do not become pregnant while taking this medicine. There is a potential for serious side effects to an unborn child. Talk to your health care professional or pharmacist for more information. Do not breast-feed an infant while taking this medicine. Do not let others touch your urine or other body fluids for 5 days after each treatment with this medicine. Caregivers should wear latex gloves to avoid touching body fluids during this time. There is a maximum amount  of this medicine you should receive throughout your life. The amount depends on the medical condition being treated and your overall health. Your doctor will watch how much of this medicine you receive in your lifetime. Tell your doctor if you have taken this medicine before. What side effects may I notice from receiving this medicine? Side effects that you should report to  your doctor or health care professional as soon as possible: -allergic reactions like skin rash, itching or hives, swelling of the face, lips, or tongue -low blood counts - this medicine may decrease the number of white blood cells, red blood cells and platelets. You may be at increased risk for infections and bleeding. -signs of infection - fever or chills, cough, sore throat, pain or difficulty passing urine -signs of decreased platelets or bleeding - bruising, pinpoint red spots on the skin, black, tarry stools, blood in the urine -signs of decreased red blood cells - unusually weak or tired, fainting spells, lightheadedness -breathing problems -chest pain -fast, irregular heartbeat -mouth sores -nausea, vomiting -pain, swelling, redness at site where injected -pain, tingling, numbness in the hands or feet -swelling of ankles, feet, or hands -unusual bleeding or bruising Side effects that usually do not require medical attention (report to your doctor or health care professional if they continue or are bothersome): -diarrhea -facial flushing -hair loss -loss of appetite -missed menstrual periods -nail discoloration or damage -red or watery eyes -red colored urine -stomach upset This list may not describe all possible side effects. Call your doctor for medical advice about side effects. You may report side effects to FDA at 1-800-FDA-1088. Where should I keep my medicine? This drug is given in a hospital or clinic and will not be stored at home. NOTE: This sheet is a summary. It may not cover all possible information. If you have questions about this medicine, talk to your doctor, pharmacist, or health care provider.  2015, [employer] Standard. (2012-07-26 09:54:34)  Vincristine injection What is this medicine? VINCRISTINE (vin KRIS teen) is a chemotherapy drug. It slows the growth of cancer cells. This medicine is used to treat many types of cancer like Hodgkin's disease,  leukemia, non-Hodgkin's lymphoma, neuroblastoma (brain cancer), rhabdomyosarcoma, and Wilms' tumor. This medicine may be used for other purposes; ask your health care provider or pharmacist if you have questions. COMMON BRAND NAME(S): Oncovin, Vincasar PFS What should I tell my health care provider before I take this medicine? They need to know if you have any of these conditions: -blood disorders -gout -infection (especially chickenpox, cold sores, or herpes) -kidney disease -liver disease -lung disease -nervous system disease like Charcot-Marie-Tooth (CMT) -recent or ongoing radiation therapy -an unusual or allergic reaction to vincristine, other chemotherapy agents, other medicines, foods, dyes, or preservatives -pregnant or trying to get pregnant -breast-feeding How should I use this medicine? This drug is given as an infusion into a vein. It is administered in a hospital or clinic by a specially trained health care professional. If you have pain, swelling, burning, or any unusual feeling around the site of your injection, tell your health care professional right away. Talk to your pediatrician regarding the use of this medicine in children. While this drug may be prescribed for selected conditions, precautions do apply. Overdosage: If you think you have taken too much of this medicine contact a poison control center or emergency room at once. NOTE: This medicine is only for you. Do not share this medicine with others. What if I miss a dose?  It is important not to miss your dose. Call your doctor or health care professional if you are unable to keep an appointment. What may interact with this medicine? Do not take this medicine with any of the following medications: -itraconazole -mibefradil -voriconazole This medicine may also interact with the following medications: -cyclosporine -erythromycin -fluconazole -ketoconazole -medicines for HIV like delavirdine, efavirenz,  nevirapine -medicines for seizures like ethotoin, fosphenotoin, phenytoin -medicines to increase blood counts like filgrastim, pegfilgrastim, sargramostim -other chemotherapy drugs like cisplatin, L-asparaginase, methotrexate, mitomycin, paclitaxel -pegaspargase -vaccines -zalcitabine, ddC Talk to your doctor or health care professional before taking any of these medicines: -acetaminophen -aspirin -ibuprofen -ketoprofen -naproxen This list may not describe all possible interactions. Give your health care provider a list of all the medicines, herbs, non-prescription drugs, or dietary supplements you use. Also tell them if you smoke, drink alcohol, or use illegal drugs. Some items may interact with your medicine. What should I watch for while using this medicine? Your condition will be monitored carefully while you are receiving this medicine. You will need important blood work done while you are taking this medicine. This drug may make you feel generally unwell. This is not uncommon, as chemotherapy can affect healthy cells as well as cancer cells. Report any side effects. Continue your course of treatment even though you feel ill unless your doctor tells you to stop. In some cases, you may be given additional medicines to help with side effects. Follow all directions for their use. Call your doctor or health care professional for advice if you get a fever, chills or sore throat, or other symptoms of a cold or flu. Do not treat yourself. Avoid taking products that contain aspirin, acetaminophen, ibuprofen, naproxen, or ketoprofen unless instructed by your doctor. These medicines may hide a fever. Do not become pregnant while taking this medicine. Women should inform their doctor if they wish to become pregnant or think they might be pregnant. There is a potential for serious side effects to an unborn child. Talk to your health care professional or pharmacist for more information. Do not  breast-feed an infant while taking this medicine. Men may have a lower sperm count while taking this medicine. Talk to your doctor if you plan to father a child. What side effects may I notice from receiving this medicine? Side effects that you should report to your doctor or health care professional as soon as possible: -allergic reactions like skin rash, itching or hives, swelling of the face, lips, or tongue -breathing problems -confusion or changes in emotions or moods -constipation -cough -mouth sores -muscle weakness -nausea and vomiting -pain, swelling, redness or irritation at the injection site -pain, tingling, numbness in the hands or feet -problems with balance, talking, walking -seizures -stomach pain -trouble passing urine or change in the amount of urine Side effects that usually do not require medical attention (report to your doctor or health care professional if they continue or are bothersome): -diarrhea -hair loss -jaw pain -loss of appetite This list may not describe all possible side effects. Call your doctor for medical advice about side effects. You may report side effects to FDA at 1-800-FDA-1088. Where should I keep my medicine? This drug is given in a hospital or clinic and will not be stored at home. NOTE: This sheet is a summary. It may not cover all possible information. If you have questions about this medicine, talk to your doctor, pharmacist, or health care provider.  2015, [employer]/Gold Standard. (2007-12-26 17:17:13)  Cyclophosphamide  injection What is this medicine? CYCLOPHOSPHAMIDE (sye kloe FOSS fa mide) is a chemotherapy drug. It slows the growth of cancer cells. This medicine is used to treat many types of cancer like lymphoma, myeloma, leukemia, breast cancer, and ovarian cancer, to name a few. This medicine may be used for other purposes; ask your health care provider or pharmacist if you have questions. COMMON BRAND NAME(S): Cytoxan,  Neosar What should I tell my health care provider before I take this medicine? They need to know if you have any of these conditions: -blood disorders -history of other chemotherapy -infection -kidney disease -liver disease -recent or ongoing radiation therapy -tumors in the bone marrow -an unusual or allergic reaction to cyclophosphamide, other chemotherapy, other medicines, foods, dyes, or preservatives -pregnant or trying to get pregnant -breast-feeding How should I use this medicine? This drug is usually given as an injection into a vein or muscle or by infusion into a vein. It is administered in a hospital or clinic by a specially trained health care professional. Talk to your pediatrician regarding the use of this medicine in children. Special care may be needed. Overdosage: If you think you have taken too much of this medicine contact a poison control center or emergency room at once. NOTE: This medicine is only for you. Do not share this medicine with others. What if I miss a dose? It is important not to miss your dose. Call your doctor or health care professional if you are unable to keep an appointment. What may interact with this medicine? This medicine may interact with the following medications: -amiodarone -amphotericin B -azathioprine -certain antiviral medicines for HIV or AIDS such as protease inhibitors (e.g., indinavir, ritonavir) and zidovudine -certain blood pressure medications such as benazepril, captopril, enalapril, fosinopril, lisinopril, moexipril, monopril, perindopril, quinapril, ramipril, trandolapril -certain cancer medications such as anthracyclines (e.g., daunorubicin, doxorubicin), busulfan, cytarabine, paclitaxel, pentostatin, tamoxifen, trastuzumab -certain diuretics such as chlorothiazide, chlorthalidone, hydrochlorothiazide, indapamide, metolazone -certain medicines that treat or prevent blood clots like warfarin -certain muscle relaxants such as  succinylcholine -cyclosporine -etanercept -indomethacin -medicines to increase blood counts like filgrastim, pegfilgrastim, sargramostim -medicines used as general anesthesia -metronidazole -natalizumab This list may not describe all possible interactions. Give your health care provider a list of all the medicines, herbs, non-prescription drugs, or dietary supplements you use. Also tell them if you smoke, drink alcohol, or use illegal drugs. Some items may interact with your medicine. What should I watch for while using this medicine? Visit your doctor for checks on your progress. This drug may make you feel generally unwell. This is not uncommon, as chemotherapy can affect healthy cells as well as cancer cells. Report any side effects. Continue your course of treatment even though you feel ill unless your doctor tells you to stop. Drink water or other fluids as directed. Urinate often, even at night. In some cases, you may be given additional medicines to help with side effects. Follow all directions for their use. Call your doctor or health care professional for advice if you get a fever, chills or sore throat, or other symptoms of a cold or flu. Do not treat yourself. This drug decreases your body's ability to fight infections. Try to avoid being around people who are sick. This medicine may increase your risk to bruise or bleed. Call your doctor or health care professional if you notice any unusual bleeding. Be careful brushing and flossing your teeth or using a toothpick because you may get an infection or bleed more  easily. If you have any dental work done, tell your dentist you are receiving this medicine. You may get drowsy or dizzy. Do not drive, use machinery, or do anything that needs mental alertness until you know how this medicine affects you. Do not become pregnant while taking this medicine or for 1 year after stopping it. Women should inform their doctor if they wish to become  pregnant or think they might be pregnant. Men should not father a child while taking this medicine and for 4 months after stopping it. There is a potential for serious side effects to an unborn child. Talk to your health care professional or pharmacist for more information. Do not breast-feed an infant while taking this medicine. This medicine may interfere with the ability to have a child. This medicine has caused ovarian failure in some women. This medicine has caused reduced sperm counts in some men. You should talk with your doctor or health care professional if you are concerned about your fertility. If you are going to have surgery, tell your doctor or health care professional that you have taken this medicine. What side effects may I notice from receiving this medicine? Side effects that you should report to your doctor or health care professional as soon as possible: -allergic reactions like skin rash, itching or hives, swelling of the face, lips, or tongue -low blood counts - this medicine may decrease the number of white blood cells, red blood cells and platelets. You may be at increased risk for infections and bleeding. -signs of infection - fever or chills, cough, sore throat, pain or difficulty passing urine -signs of decreased platelets or bleeding - bruising, pinpoint red spots on the skin, black, tarry stools, blood in the urine -signs of decreased red blood cells - unusually weak or tired, fainting spells, lightheadedness -breathing problems -dark urine -dizziness -palpitations -swelling of the ankles, feet, hands -trouble passing urine or change in the amount of urine -weight gain -yellowing of the eyes or skin Side effects that usually do not require medical attention (report to your doctor or health care professional if they continue or are bothersome): -changes in nail or skin color -hair loss -missed menstrual periods -mouth sores -nausea, vomiting This list may not  describe all possible side effects. Call your doctor for medical advice about side effects. You may report side effects to FDA at 1-800-FDA-1088. Where should I keep my medicine? This drug is given in a hospital or clinic and will not be stored at home. NOTE: This sheet is a summary. It may not cover all possible information. If you have questions about this medicine, talk to your doctor, pharmacist, or health care provider.  2015, [employer] Standard. (2012-02-12 16:22:58)    Zoledronic Acid injection (Hypercalcemia, Oncology) What is this medicine? ZOLEDRONIC ACID (ZOE le dron ik AS id) lowers the amount of calcium loss from bone. It is used to treat too much calcium in your blood from cancer. It is also used to prevent complications of cancer that has spread to the bone. This medicine may be used for other purposes; ask your health care provider or pharmacist if you have questions. What should I tell my health care provider before I take this medicine? They need to know if you have any of these conditions: -aspirin-sensitive asthma -cancer, especially if you are receiving medicines used to treat cancer -dental disease or wear dentures -infection -kidney disease -receiving corticosteroids like dexamethasone or prednisone -an unusual or allergic reaction to zoledronic acid, other  medicines, foods, dyes, or preservatives -pregnant or trying to get pregnant -breast-feeding How should I use this medicine? This medicine is for infusion into a vein. It is given by a health care professional in a hospital or clinic setting. Talk to your pediatrician regarding the use of this medicine in children. Special care may be needed. Overdosage: If you think you have taken too much of this medicine contact a poison control center or emergency room at once. NOTE: This medicine is only for you. Do not share this medicine with others. What if I miss a dose? It is important not to miss your dose. Call  your doctor or health care professional if you are unable to keep an appointment. What may interact with this medicine? -certain antibiotics given by injection -NSAIDs, medicines for pain and inflammation, like ibuprofen or naproxen -some diuretics like bumetanide, furosemide -teriparatide -thalidomide This list may not describe all possible interactions. Give your health care provider a list of all the medicines, herbs, non-prescription drugs, or dietary supplements you use. Also tell them if you smoke, drink alcohol, or use illegal drugs. Some items may interact with your medicine. What should I watch for while using this medicine? Visit your doctor or health care professional for regular checkups. It may be some time before you see the benefit from this medicine. Do not stop taking your medicine unless your doctor tells you to. Your doctor may order blood tests or other tests to see how you are doing. Women should inform their doctor if they wish to become pregnant or think they might be pregnant. There is a potential for serious side effects to an unborn child. Talk to your health care professional or pharmacist for more information. You should make sure that you get enough calcium and vitamin D while you are taking this medicine. Discuss the foods you eat and the vitamins you take with your health care professional. Some people who take this medicine have severe bone, joint, and/or muscle pain. This medicine may also increase your risk for jaw problems or a broken thigh bone. Tell your doctor right away if you have severe pain in your jaw, bones, joints, or muscles. Tell your doctor if you have any pain that does not go away or that gets worse. Tell your dentist and dental surgeon that you are taking this medicine. You should not have major dental surgery while on this medicine. See your dentist to have a dental exam and fix any dental problems before starting this medicine. Take good care of your  teeth while on this medicine. Make sure you see your dentist for regular follow-up appointments. What side effects may I notice from receiving this medicine? Side effects that you should report to your doctor or health care professional as soon as possible: -allergic reactions like skin rash, itching or hives, swelling of the face, lips, or tongue -anxiety, confusion, or depression -breathing problems -changes in vision -eye pain -feeling faint or lightheaded, falls -jaw pain, especially after dental work -mouth sores -muscle cramps, stiffness, or weakness -redness, blistering, peeling or loosening of the skin, including inside the mouth -trouble passing urine or change in the amount of urine Side effects that usually do not require medical attention (report to your doctor or health care professional if they continue or are bothersome): -bone, joint, or muscle pain -constipation -diarrhea -fever -hair loss -irritation at site where injected -loss of appetite -nausea, vomiting -stomach upset -trouble sleeping -trouble swallowing -weak or tired This list may  not describe all possible side effects. Call your doctor for medical advice about side effects. You may report side effects to FDA at 1-800-FDA-1088. Where should I keep my medicine? This drug is given in a hospital or clinic and will not be stored at home. NOTE: This sheet is a summary. It may not cover all possible information. If you have questions about this medicine, talk to your doctor, pharmacist, or health care provider.    2016, [employer]vier/Gold Standard. (2013-08-26 14:19:39)

## 2015-02-05 ENCOUNTER — Other Ambulatory Visit: Payer: Self-pay | Admitting: Nurse Practitioner

## 2015-02-05 DIAGNOSIS — C8462 Anaplastic large cell lymphoma, ALK-positive, intrathoracic lymph nodes: Secondary | ICD-10-CM

## 2015-02-05 MED ORDER — ALLOPURINOL 100 MG PO TABS: 100.0000 mg | ORAL_TABLET | Freq: Every day | ORAL | Status: DC

## 2015-02-13 ENCOUNTER — Ambulatory Visit (HOSPITAL_COMMUNITY)
Admission: RE | Admit: 2015-02-13 | Discharge: 2015-02-13 | Disposition: A | Discharge status: Home / Self Care | Payer: Commercial Managed Care - PPO | Source: Ambulatory Visit | Attending: Hematology & Oncology | Admitting: Hematology & Oncology

## 2015-02-13 DIAGNOSIS — R918 Other nonspecific abnormal finding of lung field: Secondary | ICD-10-CM | POA: Diagnosis not present

## 2015-02-13 DIAGNOSIS — N400 Enlarged prostate without lower urinary tract symptoms: Secondary | ICD-10-CM | POA: Diagnosis not present

## 2015-02-13 DIAGNOSIS — N289 Disorder of kidney and ureter, unspecified: Secondary | ICD-10-CM | POA: Diagnosis not present

## 2015-02-13 DIAGNOSIS — I2510 Atherosclerotic heart disease of native coronary artery without angina pectoris: Secondary | ICD-10-CM | POA: Diagnosis not present

## 2015-02-13 DIAGNOSIS — C8462 Anaplastic large cell lymphoma, ALK-positive, intrathoracic lymph nodes: Secondary | ICD-10-CM | POA: Diagnosis not present

## 2015-02-13 DIAGNOSIS — Z79899 Other long term (current) drug therapy: Secondary | ICD-10-CM | POA: Diagnosis not present

## 2015-02-13 DIAGNOSIS — I6523 Occlusion and stenosis of bilateral carotid arteries: Secondary | ICD-10-CM | POA: Diagnosis not present

## 2015-02-13 DIAGNOSIS — I700 Atherosclerosis of aorta: Secondary | ICD-10-CM | POA: Insufficient documentation

## 2015-02-13 DIAGNOSIS — M84454A Pathological fracture, pelvis, initial encounter for fracture: Secondary | ICD-10-CM | POA: Diagnosis not present

## 2015-02-13 LAB — GLUCOSE, CAPILLARY: Glucose-Capillary: 91 mg/dL (ref 65–99)

## 2015-02-13 MED ORDER — FLUDEOXYGLUCOSE F - 18 (FDG) INJECTION
7.2800 | Freq: Once | INTRAVENOUS | Status: DC | PRN
  Administered 2015-02-13: 7.28 via INTRAVENOUS
  Filled 2015-02-13: qty 7.28

## 2015-02-19 ENCOUNTER — Other Ambulatory Visit: Payer: Self-pay | Admitting: *Deleted

## 2015-02-19 DIAGNOSIS — C8462 Anaplastic large cell lymphoma, ALK-positive, intrathoracic lymph nodes: Secondary | ICD-10-CM

## 2015-02-20 ENCOUNTER — Ambulatory Visit (HOSPITAL_BASED_OUTPATIENT_CLINIC_OR_DEPARTMENT_OTHER): Payer: Commercial Managed Care - PPO | Admitting: Hematology & Oncology

## 2015-02-20 ENCOUNTER — Other Ambulatory Visit (HOSPITAL_BASED_OUTPATIENT_CLINIC_OR_DEPARTMENT_OTHER): Payer: Commercial Managed Care - PPO

## 2015-02-20 ENCOUNTER — Encounter: Payer: Self-pay | Admitting: Hematology & Oncology

## 2015-02-20 ENCOUNTER — Ambulatory Visit (HOSPITAL_BASED_OUTPATIENT_CLINIC_OR_DEPARTMENT_OTHER): Payer: Commercial Managed Care - PPO

## 2015-02-20 VITALS — BP 115/76 | HR 96 | Temp 97.6°F | Resp 16 | Ht 67.0 in | Wt 143.0 lb

## 2015-02-20 DIAGNOSIS — C8460 Anaplastic large cell lymphoma, ALK-positive, unspecified site: Secondary | ICD-10-CM

## 2015-02-20 DIAGNOSIS — M899 Disorder of bone, unspecified: Secondary | ICD-10-CM

## 2015-02-20 DIAGNOSIS — C8462 Anaplastic large cell lymphoma, ALK-positive, intrathoracic lymph nodes: Secondary | ICD-10-CM

## 2015-02-20 DIAGNOSIS — Z5111 Encounter for antineoplastic chemotherapy: Secondary | ICD-10-CM

## 2015-02-20 LAB — CMP (CANCER CENTER ONLY)
ALT: 38 U/L (ref 10–47)
AST: 36 U/L (ref 11–38)
Albumin: 3.4 g/dL (ref 3.3–5.5)
Alkaline Phosphatase: 130 U/L — ABNORMAL HIGH (ref 26–84)
BILIRUBIN TOTAL: 0.5 mg/dL (ref 0.20–1.60)
BUN: 12 mg/dL (ref 7–22)
CALCIUM: 8.3 mg/dL (ref 8.0–10.3)
CO2: 25 meq/L (ref 18–33)
Chloride: 103 mEq/L (ref 98–108)
Creat: 0.9 mg/dl (ref 0.6–1.2)
GLUCOSE: 133 mg/dL — AB (ref 73–118)
POTASSIUM: 3.9 meq/L (ref 3.3–4.7)
Sodium: 140 mEq/L (ref 128–145)
Total Protein: 6.6 g/dL (ref 6.4–8.1)

## 2015-02-20 LAB — CBC WITH DIFFERENTIAL (CANCER CENTER ONLY)
BASO#: 0 10*3/uL (ref 0.0–0.2)
BASO%: 0.6 % (ref 0.0–2.0)
EOS%: 0.3 % (ref 0.0–7.0)
Eosinophils Absolute: 0 10*3/uL (ref 0.0–0.5)
HEMATOCRIT: 40.7 % (ref 38.7–49.9)
HGB: 13.4 g/dL (ref 13.0–17.1)
LYMPH#: 1 10*3/uL (ref 0.9–3.3)
LYMPH%: 29.2 % (ref 14.0–48.0)
MCH: 27.9 pg — ABNORMAL LOW (ref 28.0–33.4)
MCHC: 32.9 g/dL (ref 32.0–35.9)
MCV: 85 fL (ref 82–98)
MONO#: 0.4 10*3/uL (ref 0.1–0.9)
MONO%: 12.8 % (ref 0.0–13.0)
NEUT#: 1.9 10*3/uL (ref 1.5–6.5)
NEUT%: 57.1 % (ref 40.0–80.0)
PLATELETS: 431 10*3/uL — AB (ref 145–400)
RBC: 4.8 10*6/uL (ref 4.20–5.70)
RDW: 16.1 % — AB (ref 11.1–15.7)
WBC: 3.3 10*3/uL — ABNORMAL LOW (ref 4.0–10.0)

## 2015-02-20 LAB — LACTATE DEHYDROGENASE (CC13): LDH: 180 U/L (ref 125–245)

## 2015-02-20 LAB — TECHNOLOGIST REVIEW CHCC SATELLITE

## 2015-02-20 MED ORDER — SODIUM CHLORIDE 0.9 % IV SOLN
Freq: Once | INTRAVENOUS | Status: AC
  Administered 2015-02-20: 10:00:00 via INTRAVENOUS
  Filled 2015-02-20: qty 8

## 2015-02-20 MED ORDER — VINCRISTINE SULFATE CHEMO INJECTION 1 MG/ML
1.5000 mg | Freq: Once | INTRAVENOUS | Status: AC
  Administered 2015-02-20: 1.5 mg via INTRAVENOUS
  Filled 2015-02-20: qty 1.5

## 2015-02-20 MED ORDER — ZOLEDRONIC ACID 4 MG/100ML IV SOLN
4.0000 mg | Freq: Once | INTRAVENOUS | Status: AC
  Administered 2015-02-20: 4 mg via INTRAVENOUS
  Filled 2015-02-20: qty 100

## 2015-02-20 MED ORDER — SODIUM CHLORIDE 0.9 % IV SOLN: Freq: Once | INTRAVENOUS | Status: DC

## 2015-02-20 MED ORDER — DOXORUBICIN HCL CHEMO IV INJECTION 2 MG/ML
50.0000 mg/m2 | Freq: Once | INTRAVENOUS | Status: AC
  Administered 2015-02-20: 88 mg via INTRAVENOUS
  Filled 2015-02-20: qty 44

## 2015-02-20 MED ORDER — SODIUM CHLORIDE 0.9 % IV SOLN
Freq: Once | INTRAVENOUS | Status: AC
  Administered 2015-02-20: 09:00:00 via INTRAVENOUS

## 2015-02-20 MED ORDER — SODIUM CHLORIDE 0.9 % IV SOLN
750.0000 mg/m2 | Freq: Once | INTRAVENOUS | Status: AC
  Administered 2015-02-20: 1320 mg via INTRAVENOUS
  Filled 2015-02-20: qty 66

## 2015-02-20 NOTE — Patient Instructions (Signed)
[facility] Discharge Instructions for Patients Receiving Chemotherapy  Today you received the following chemotherapy agents Cytoxan, Adriamycin, Oncovin, Prednisone  To help prevent nausea and vomiting after your treatment, we encourage you to take your nausea medication    If you develop nausea and vomiting that is not controlled by your nausea medication, call the clinic.   BELOW ARE SYMPTOMS THAT SHOULD BE REPORTED IMMEDIATELY:  *FEVER GREATER THAN 100.5 F  *CHILLS WITH OR WITHOUT FEVER  NAUSEA AND VOMITING THAT IS NOT CONTROLLED WITH YOUR NAUSEA MEDICATION  *UNUSUAL SHORTNESS OF BREATH  *UNUSUAL BRUISING OR BLEEDING  TENDERNESS IN MOUTH AND THROAT WITH OR WITHOUT PRESENCE OF ULCERS  *URINARY PROBLEMS  *BOWEL PROBLEMS  UNUSUAL RASH Items with * indicate a potential emergency and should be followed up as soon as possible.  Feel free to call the clinic you have any questions or concerns. The clinic phone number is [phone].  Please show the [facility] at check-in to the Emergency Department and triage nurse.

## 2015-02-20 NOTE — Progress Notes (Signed)
Hematology and Oncology Follow Up Visit  Logan ARCHULETTA [id number] [birth year] 64 y.o. 02/20/2015   Principle Diagnosis:   Anaplastic large cell non-Hodgkin's lymphoma-ALK (+)  Current Therapy:    Status post cycle 2 of CHOP  Zometa 4 mg IV every month     Interim History:  Logan Mendoza is back for follow-up. He looks pretty good. He feels okay. He's when I complain much in the way of bone pain.  He did not do too well with the first dose of Zometa. It may been from the [place] going into quickly. I made sure that told his nurse to give the Zometa over 45 minutes. I also told him to try some Claritin if he has bone pain. Her F we did go ahead and repeat his PET scan. This was done on November 2. The PET scan, thank you, showed a very nice response. He had a marked decrease in osseous lesions. There is no new areas of involvement. He has some chronic fractures noted.  I suspect that he will need some radiation therapy once he is done with chemotherapy.  He's had no fever. He's had no bleeding. He's had no change in bowel or bladder habits.   Overall, his performance status is ECOG 1.  Medications:  Current outpatient prescriptions:  .  allopurinol (ZYLOPRIM) 100 MG tablet, Take 1 tablet (100 mg total) by mouth daily., Disp: 30 tablet, Rfl: 3 .  aspirin EC 81 MG tablet, Take 81 mg by mouth., Disp: , Rfl:  .  Cholecalciferol (VITAMIN D-1000 MAX ST) 1000 UNITS tablet, Take 2,000 Units by mouth 3 (three) times daily. , Disp: , Rfl:  .  co-enzyme Q-10 30 MG capsule, Take 200 mg by mouth daily. , Disp: , Rfl:  .  famciclovir (FAMVIR) 250 MG tablet, Take 1 tablet (250 mg total) by mouth daily., Disp: 30 tablet, Rfl: 8 .  finasteride (PROSCAR) 5 MG tablet, Take 5 mg by mouth daily., Disp: , Rfl:  .  flecainide (TAMBOCOR) 50 MG tablet, Take 50 mg by mouth 2 (two) times daily. , Disp: , Rfl:  .  GLUCOSAMINE HCL PO, Take 1 tablet by mouth daily. , Disp: , Rfl:  .  LORazepam (ATIVAN) 0.5 MG  tablet, Take 1 tablet (0.5 mg total) by mouth every 6 (six) hours as needed (Nausea or vomiting)., Disp: 30 tablet, Rfl: 0 .  Milk Thistle 140 MG CAPS, Take 250 mg by mouth daily. , Disp: , Rfl:  .  NIFEdipine (PROCARDIA-XL/ADALAT-CC/NIFEDICAL-XL) 30 MG 24 hr tablet, Take 30 mg by mouth., Disp: , Rfl:  .  ondansetron (ZOFRAN) 8 MG tablet, Take 1 tablet (8 mg total) by mouth 2 (two) times daily. Start the day after chemo for 3 days. Then as needed for nausea or vomiting., Disp: 30 tablet, Rfl: 1 .  Plant Sterols and Stanols (CHOLEST OFF PO), Take 300 mg by mouth daily. , Disp: , Rfl:  .  predniSONE (DELTASONE) 20 MG tablet, Take 3 tablets (60 mg total) by mouth daily with breakfast. Take on days 1-5 of chemotherapy., Disp: 120 tablet, Rfl: 3 .  prochlorperazine (COMPAZINE) 10 MG tablet, Take 1 tablet (10 mg total) by mouth every 6 (six) hours as needed (Nausea or vomiting)., Disp: 30 tablet, Rfl: 6 .  Red Yeast Rice Extract (RED YEAST RICE PO), Take 600 mg by mouth 2 (two) times daily. , Disp: , Rfl:  .  saw palmetto 500 MG capsule, Take 450 mg by mouth daily., Disp: ,  Rfl:  .  testosterone enanthate (DELATESTRYL) 200 MG/ML injection, Inject into the muscle every 14 (fourteen) days. 4 ml every 2 weeks, Disp: , Rfl:  No current facility-administered medications for this visit.  Facility-Administered Medications Ordered in Other Visits:  .  0.9 %  sodium chloride infusion, , Intravenous, Once, Volanda Napoleon, MD .  cyclophosphamide (CYTOXAN) 1,320 mg in sodium chloride 0.9 % 250 mL chemo infusion, 750 mg/m2 (Treatment Plan Actual), Intravenous, Once, Volanda Napoleon, MD .  DOXOrubicin (ADRIAMYCIN) chemo injection 88 mg, 50 mg/m2 (Treatment Plan Actual), Intravenous, Once, Volanda Napoleon, MD .  ondansetron (ZOFRAN) 16 mg, dexamethasone (DECADRON) 20 mg in sodium chloride 0.9 % 50 mL IVPB, , Intravenous, Once, Volanda Napoleon, MD .  vinCRIStine (ONCOVIN) 1.5 mg in sodium chloride 0.9 % 50 mL chemo  infusion, 1.5 mg, Intravenous, Once, Volanda Napoleon, MD .  Zoledronic Acid (ZOMETA) 4 mg IVPB, 4 mg, Intravenous, Once, Volanda Napoleon, MD, Last Rate: 133.3 mL/hr at 02/20/15 0857, 4 mg at 02/20/15 0857  Allergies:  Allergies  Allergen Reactions  . Sulfa Antibiotics Hives  . Sulfa Drugs Cross Reactors Itching and Rash    Past Medical History, Surgical history, Social history, and Family History were reviewed and updated.  Review of Systems: As above  Physical Exam:  height is 5' 7"  (1.702 m) and weight is 143 lb (64.864 kg). His oral temperature is 97.6 F (36.4 C). His blood pressure is 115/76 and his pulse is 96. His respiration is 16.   Wt Readings from Last 3 Encounters:  02/20/15 143 lb (64.864 kg)  02/01/15 145 lb (65.772 kg)  12/19/14 145 lb (65.772 kg)     Well-developed and well-nourished white gentleman head and neck exam shows no ocular lesions. There is no scleral icterus. He has no oral mucositis. There is no adenopathy in the neck. Lungs are clear bilaterally. Cardiac exam regular rate and rhythm with no murmurs, rubs or bruits. Abdomen is soft. Has good bowel sounds. There is no fluid wave. There is no palpable liver or spleen tip. Back exam shows no tenderness over the spine, ribs or hips. External shows no clubbing, cyanosis or edema. He has good range motion of his joints. There is no tenderness over the hips bilaterally. Skin exam shows no rashes, ecchymoses or petechia. Neurological exam shows no focal neurological deficits.  Lab Results  Component Value Date   WBC 3.3* 02/20/2015   HGB 13.4 02/20/2015   HCT 40.7 02/20/2015   MCV 85 02/20/2015   PLT 431* 02/20/2015     Chemistry      Component Value Date/Time   NA 140 02/20/2015 0752   NA 135 12/19/2014 2151   K 3.9 02/20/2015 0752   K 4.2 12/19/2014 2151   CL 103 02/20/2015 0752   CL 100* 12/19/2014 2151   CO2 25 02/20/2015 0752   CO2 25 12/19/2014 2151   BUN 12 02/20/2015 0752   BUN 18  12/19/2014 2151   CREATININE 0.9 02/20/2015 0752   CREATININE 0.77 12/19/2014 2151      Component Value Date/Time   CALCIUM 8.3 02/20/2015 0752   CALCIUM 9.3 12/19/2014 2151   ALKPHOS 130* 02/20/2015 0752   ALKPHOS 140* 12/10/2014 1415   AST 36 02/20/2015 0752   AST 14 12/10/2014 1415   ALT 38 02/20/2015 0752   ALT 10 12/10/2014 1415   BILITOT 0.50 02/20/2015 0752   BILITOT 0.3 12/10/2014 1415  Impression and Plan: Logan Mendoza is a 64 year old white male. He has anaplastic large cell non-Hodgkin's lymphoma. He has a lymphoma that is ALK positive.  Again, he is responding to treatment. The PET scan looks much better.  I think that he probably will need 8 cycles of treatment. I also suspect that he will need summarization therapy afterwards.  I told him that they Zometa is very important. I told him that some people do have a reaction with the first dose but then after that, if things get better.  We will go ahead and get started on treatment. This vias third cycle of treatment.  I will plan to get him back in 3 more weeks.    Volanda Napoleon, MD 11/9/20169:31 AM

## 2015-03-14 ENCOUNTER — Other Ambulatory Visit (HOSPITAL_BASED_OUTPATIENT_CLINIC_OR_DEPARTMENT_OTHER): Payer: Commercial Managed Care - PPO

## 2015-03-14 ENCOUNTER — Encounter: Payer: Self-pay | Admitting: Hematology & Oncology

## 2015-03-14 ENCOUNTER — Ambulatory Visit (HOSPITAL_BASED_OUTPATIENT_CLINIC_OR_DEPARTMENT_OTHER): Payer: Commercial Managed Care - PPO | Admitting: Hematology & Oncology

## 2015-03-14 ENCOUNTER — Ambulatory Visit (HOSPITAL_BASED_OUTPATIENT_CLINIC_OR_DEPARTMENT_OTHER): Payer: Commercial Managed Care - PPO

## 2015-03-14 VITALS — BP 118/78 | HR 90 | Temp 97.6°F | Resp 18 | Ht 67.0 in | Wt 144.0 lb

## 2015-03-14 DIAGNOSIS — Z5111 Encounter for antineoplastic chemotherapy: Secondary | ICD-10-CM | POA: Diagnosis not present

## 2015-03-14 DIAGNOSIS — M899 Disorder of bone, unspecified: Secondary | ICD-10-CM | POA: Diagnosis not present

## 2015-03-14 DIAGNOSIS — C8462 Anaplastic large cell lymphoma, ALK-positive, intrathoracic lymph nodes: Secondary | ICD-10-CM

## 2015-03-14 LAB — TECHNOLOGIST REVIEW CHCC SATELLITE

## 2015-03-14 LAB — CBC WITH DIFFERENTIAL (CANCER CENTER ONLY)
BASO#: 0 10*3/uL (ref 0.0–0.2)
BASO%: 0.6 % (ref 0.0–2.0)
EOS%: 0.8 % (ref 0.0–7.0)
Eosinophils Absolute: 0 10*3/uL (ref 0.0–0.5)
HCT: 41.5 % (ref 38.7–49.9)
HGB: 13.8 g/dL (ref 13.0–17.1)
LYMPH#: 0.9 10*3/uL (ref 0.9–3.3)
LYMPH%: 18.5 % (ref 14.0–48.0)
MCH: 28.6 pg (ref 28.0–33.4)
MCHC: 33.3 g/dL (ref 32.0–35.9)
MCV: 86 fL (ref 82–98)
MONO#: 0.5 10*3/uL (ref 0.1–0.9)
MONO%: 10 % (ref 0.0–13.0)
NEUT%: 70.1 % (ref 40.0–80.0)
NEUTROS ABS: 3.4 10*3/uL (ref 1.5–6.5)
Platelets: 356 10*3/uL (ref 145–400)
RBC: 4.83 10*6/uL (ref 4.20–5.70)
RDW: 17.4 % — AB (ref 11.1–15.7)
WBC: 4.8 10*3/uL (ref 4.0–10.0)

## 2015-03-14 LAB — CMP (CANCER CENTER ONLY)
ALT(SGPT): 32 U/L (ref 10–47)
AST: 31 U/L (ref 11–38)
Albumin: 3.4 g/dL (ref 3.3–5.5)
Alkaline Phosphatase: 118 U/L — ABNORMAL HIGH (ref 26–84)
BILIRUBIN TOTAL: 0.5 mg/dL (ref 0.20–1.60)
BUN: 13 mg/dL (ref 7–22)
CO2: 25 meq/L (ref 18–33)
CREATININE: 0.8 mg/dL (ref 0.6–1.2)
Calcium: 9.1 mg/dL (ref 8.0–10.3)
Chloride: 98 mEq/L (ref 98–108)
GLUCOSE: 113 mg/dL (ref 73–118)
Potassium: 4.1 mEq/L (ref 3.3–4.7)
SODIUM: 137 meq/L (ref 128–145)
Total Protein: 6.9 g/dL (ref 6.4–8.1)

## 2015-03-14 LAB — LACTATE DEHYDROGENASE (CC13): LDH: 191 U/L (ref 125–245)

## 2015-03-14 MED ORDER — SODIUM CHLORIDE 0.9 % IV SOLN
750.0000 mg/m2 | Freq: Once | INTRAVENOUS | Status: AC
  Administered 2015-03-14: 1320 mg via INTRAVENOUS
  Filled 2015-03-14: qty 66

## 2015-03-14 MED ORDER — ZOLEDRONIC ACID 4 MG/100ML IV SOLN
4.0000 mg | Freq: Once | INTRAVENOUS | Status: AC
  Administered 2015-03-14: 4 mg via INTRAVENOUS
  Filled 2015-03-14: qty 100

## 2015-03-14 MED ORDER — SODIUM CHLORIDE 0.9 % IV SOLN
Freq: Once | INTRAVENOUS | Status: AC
  Administered 2015-03-14: 09:00:00 via INTRAVENOUS

## 2015-03-14 MED ORDER — VINCRISTINE SULFATE CHEMO INJECTION 1 MG/ML
1.5000 mg | Freq: Once | INTRAVENOUS | Status: AC
  Administered 2015-03-14: 1.5 mg via INTRAVENOUS
  Filled 2015-03-14: qty 1.5

## 2015-03-14 MED ORDER — SODIUM CHLORIDE 0.9 % IV SOLN: Freq: Once | INTRAVENOUS | Status: DC

## 2015-03-14 MED ORDER — SODIUM CHLORIDE 0.9 % IV SOLN
Freq: Once | INTRAVENOUS | Status: AC
  Administered 2015-03-14: 09:00:00 via INTRAVENOUS
  Filled 2015-03-14: qty 8

## 2015-03-14 MED ORDER — DOXORUBICIN HCL CHEMO IV INJECTION 2 MG/ML
50.0000 mg/m2 | Freq: Once | INTRAVENOUS | Status: AC
  Administered 2015-03-14: 88 mg via INTRAVENOUS
  Filled 2015-03-14: qty 44

## 2015-03-14 NOTE — Patient Instructions (Signed)
[facility] Discharge Instructions for Patients Receiving Chemotherapy  Today you received the following chemotherapy agents Cytoxan, Adriamycin, Oncovin, Prednisone  To help prevent nausea and vomiting after your treatment, we encourage you to take your nausea medication    If you develop nausea and vomiting that is not controlled by your nausea medication, call the clinic.   BELOW ARE SYMPTOMS THAT SHOULD BE REPORTED IMMEDIATELY:  *FEVER GREATER THAN 100.5 F  *CHILLS WITH OR WITHOUT FEVER  NAUSEA AND VOMITING THAT IS NOT CONTROLLED WITH YOUR NAUSEA MEDICATION  *UNUSUAL SHORTNESS OF BREATH  *UNUSUAL BRUISING OR BLEEDING  TENDERNESS IN MOUTH AND THROAT WITH OR WITHOUT PRESENCE OF ULCERS  *URINARY PROBLEMS  *BOWEL PROBLEMS  UNUSUAL RASH Items with * indicate a potential emergency and should be followed up as soon as possible.  Feel free to call the clinic you have any questions or concerns. The clinic phone number is [phone].  Please show the [facility] at check-in to the Emergency Department and triage nurse.

## 2015-03-14 NOTE — Progress Notes (Signed)
Hematology and Oncology Follow Up Visit  Logan Mendoza [id number] 03-10-1951 64 y.o. 03/14/2015   Principle Diagnosis:   Anaplastic large cell non-Hodgkin's lymphoma-ALK (+)  Current Therapy:    Status post cycle 3 of CHOP  Zometa 4 mg IV every month     Interim History:  Logan Mendoza is back for follow-up. He looks quite good. He feels better. He had a very good thanks giving with his family.  He is eating well. He's had no problems with nausea or vomiting.  He's not having the problems with pain. He has actually started walking a little bit more.  His no issues with bowels or bladder.  He had no problems with the last dose of Zometa.  He's had no rashes. He's had no leg swelling.  He's had no headache.  He's had no mouth sores.  Overall, his performance status is ECOG 1.  Medications:  Current outpatient prescriptions:  .  allopurinol (ZYLOPRIM) 100 MG tablet, Take 1 tablet (100 mg total) by mouth daily., Disp: 30 tablet, Rfl: 3 .  aspirin EC 81 MG tablet, Take 81 mg by mouth., Disp: , Rfl:  .  Cholecalciferol (VITAMIN D-1000 MAX ST) 1000 UNITS tablet, Take 2,000 Units by mouth 3 (three) times daily. , Disp: , Rfl:  .  co-enzyme Q-10 30 MG capsule, Take 200 mg by mouth daily. , Disp: , Rfl:  .  famciclovir (FAMVIR) 250 MG tablet, Take 1 tablet (250 mg total) by mouth daily., Disp: 30 tablet, Rfl: 8 .  finasteride (PROSCAR) 5 MG tablet, Take 5 mg by mouth daily., Disp: , Rfl:  .  flecainide (TAMBOCOR) 50 MG tablet, Take 50 mg by mouth 2 (two) times daily. , Disp: , Rfl:  .  GLUCOSAMINE HCL PO, Take 1 tablet by mouth daily. , Disp: , Rfl:  .  LORazepam (ATIVAN) 0.5 MG tablet, Take 1 tablet (0.5 mg total) by mouth every 6 (six) hours as needed (Nausea or vomiting)., Disp: 30 tablet, Rfl: 0 .  Milk Thistle 140 MG CAPS, Take 250 mg by mouth daily. , Disp: , Rfl:  .  NIFEdipine (PROCARDIA-XL/ADALAT-CC/NIFEDICAL-XL) 30 MG 24 hr tablet, Take 30 mg by mouth., Disp: , Rfl:  .   ondansetron (ZOFRAN) 8 MG tablet, Take 1 tablet (8 mg total) by mouth 2 (two) times daily. Start the day after chemo for 3 days. Then as needed for nausea or vomiting., Disp: 30 tablet, Rfl: 1 .  Plant Sterols and Stanols (CHOLEST OFF PO), Take 300 mg by mouth daily. , Disp: , Rfl:  .  predniSONE (DELTASONE) 20 MG tablet, Take 3 tablets (60 mg total) by mouth daily with breakfast. Take on days 1-5 of chemotherapy., Disp: 120 tablet, Rfl: 3 .  prochlorperazine (COMPAZINE) 10 MG tablet, Take 1 tablet (10 mg total) by mouth every 6 (six) hours as needed (Nausea or vomiting)., Disp: 30 tablet, Rfl: 6 .  Red Yeast Rice Extract (RED YEAST RICE PO), Take 600 mg by mouth 2 (two) times daily. , Disp: , Rfl:  .  saw palmetto 500 MG capsule, Take 450 mg by mouth daily., Disp: , Rfl:  .  testosterone enanthate (DELATESTRYL) 200 MG/ML injection, Inject into the muscle every 14 (fourteen) days. 4 ml every 2 weeks, Disp: , Rfl:   Allergies:  Allergies  Allergen Reactions  . Sulfa Antibiotics Hives  . Sulfa Drugs Cross Reactors Itching and Rash    Past Medical History, Surgical history, Social history, and Family History were reviewed  and updated.  Review of Systems: As above  Physical Exam:  height is 5' 7"  (1.702 m) and weight is 144 lb (65.318 kg). His oral temperature is 97.6 F (36.4 C). His blood pressure is 118/78 and his pulse is 90. His respiration is 18.   Wt Readings from Last 3 Encounters:  03/14/15 144 lb (65.318 kg)  02/20/15 143 lb (64.864 kg)  02/01/15 145 lb (65.772 kg)     Well-developed and well-nourished white gentleman head and neck exam shows no ocular lesions. There is no scleral icterus. He has no oral mucositis. There is no adenopathy in the neck. Lungs are clear bilaterally. Cardiac exam regular rate and rhythm with no murmurs, rubs or bruits. Abdomen is soft. Has good bowel sounds. There is no fluid wave. There is no palpable liver or spleen tip. Back exam shows no  tenderness over the spine, ribs or hips. External shows no clubbing, cyanosis or edema. He has good range motion of his joints. There is no tenderness over the hips bilaterally. Skin exam shows no rashes, ecchymoses or petechia. Neurological exam shows no focal neurological deficits.  Lab Results  Component Value Date   WBC 4.8 03/14/2015   HGB 13.8 03/14/2015   HCT 41.5 03/14/2015   MCV 86 03/14/2015   PLT 356 03/14/2015     Chemistry      Component Value Date/Time   NA 140 02/20/2015 0752   NA 135 12/19/2014 2151   K 3.9 02/20/2015 0752   K 4.2 12/19/2014 2151   CL 103 02/20/2015 0752   CL 100* 12/19/2014 2151   CO2 25 02/20/2015 0752   CO2 25 12/19/2014 2151   BUN 12 02/20/2015 0752   BUN 18 12/19/2014 2151   CREATININE 0.9 02/20/2015 0752   CREATININE 0.77 12/19/2014 2151      Component Value Date/Time   CALCIUM 8.3 02/20/2015 0752   CALCIUM 9.3 12/19/2014 2151   ALKPHOS 130* 02/20/2015 0752   ALKPHOS 140* 12/10/2014 1415   AST 36 02/20/2015 0752   AST 14 12/10/2014 1415   ALT 38 02/20/2015 0752   ALT 10 12/10/2014 1415   BILITOT 0.50 02/20/2015 0752   BILITOT 0.3 12/10/2014 1415         Impression and Plan: Logan Mendoza is a 64 year old white male. He has anaplastic large cell non-Hodgkin's lymphoma. 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  We will go ahead and give him his fourth cycle of treatment. I will then plan for another PET scan on him.  I will still plan for 8 cycles. I think given the fact that he had the extra nodal disease, that he is at risk for recurrence and that we have to treat with 8 cycles.  I think the next issue is whether or not he will need any radiation therapy for areas that he had bulky disease.  We will do the Zometa today.  I'll do the PET scan right before we see him back.   get him back in 3 more weeks.    Volanda Napoleon, MD 12/1/20168:31 AM

## 2015-03-18 ENCOUNTER — Encounter: Payer: Self-pay | Admitting: Hematology & Oncology

## 2015-03-22 ENCOUNTER — Telehealth: Payer: Self-pay | Admitting: Hematology & Oncology

## 2015-03-22 NOTE — Telephone Encounter (Signed)
[employer] PRIOR AUTH APPROVED °Auth: [id number] °Valid: 04/01/2015 -09/28/2015 ° °Dx: C84.62 (ICD-10-CM) - Anaplastic large cell lymphoma, ALK-positive, intrathoracic lymph nodes ° ° °J3489 °J2405 °J1100 °J9070 °J9370 °J9371 °J9370 °J9000 ° °ID: [id number] ° ° °P: Missy O'Deil RN CCM, Clinical Nurse @ [phone] x [phone] °F: [phone]  °   °  ° ° °

## 2015-03-26 ENCOUNTER — Encounter: Payer: Self-pay | Admitting: Hematology & Oncology

## 2015-03-28 ENCOUNTER — Encounter: Payer: Self-pay | Admitting: Hematology & Oncology

## 2015-04-04 ENCOUNTER — Ambulatory Visit (HOSPITAL_BASED_OUTPATIENT_CLINIC_OR_DEPARTMENT_OTHER): Payer: Commercial Managed Care - PPO | Admitting: Hematology & Oncology

## 2015-04-04 ENCOUNTER — Encounter: Payer: Self-pay | Admitting: Hematology & Oncology

## 2015-04-04 ENCOUNTER — Other Ambulatory Visit (HOSPITAL_BASED_OUTPATIENT_CLINIC_OR_DEPARTMENT_OTHER): Payer: Commercial Managed Care - PPO

## 2015-04-04 ENCOUNTER — Ambulatory Visit (HOSPITAL_BASED_OUTPATIENT_CLINIC_OR_DEPARTMENT_OTHER): Payer: Commercial Managed Care - PPO

## 2015-04-04 VITALS — BP 125/86 | HR 83 | Temp 97.9°F | Resp 16 | Ht 67.0 in | Wt 144.0 lb

## 2015-04-04 DIAGNOSIS — M899 Disorder of bone, unspecified: Secondary | ICD-10-CM

## 2015-04-04 DIAGNOSIS — Z5111 Encounter for antineoplastic chemotherapy: Secondary | ICD-10-CM

## 2015-04-04 DIAGNOSIS — C8462 Anaplastic large cell lymphoma, ALK-positive, intrathoracic lymph nodes: Secondary | ICD-10-CM

## 2015-04-04 LAB — CBC WITH DIFFERENTIAL (CANCER CENTER ONLY)
BASO#: 0 10*3/uL (ref 0.0–0.2)
BASO%: 0.6 % (ref 0.0–2.0)
EOS%: 0.6 % (ref 0.0–7.0)
Eosinophils Absolute: 0 10*3/uL (ref 0.0–0.5)
HCT: 39 % (ref 38.7–49.9)
HEMOGLOBIN: 12.9 g/dL — AB (ref 13.0–17.1)
LYMPH#: 0.7 10*3/uL — ABNORMAL LOW (ref 0.9–3.3)
LYMPH%: 14.7 % (ref 14.0–48.0)
MCH: 28.4 pg (ref 28.0–33.4)
MCHC: 33.1 g/dL (ref 32.0–35.9)
MCV: 86 fL (ref 82–98)
MONO#: 0.5 10*3/uL (ref 0.1–0.9)
MONO%: 11 % (ref 0.0–13.0)
NEUT%: 73.1 % (ref 40.0–80.0)
NEUTROS ABS: 3.6 10*3/uL (ref 1.5–6.5)
PLATELETS: 366 10*3/uL (ref 145–400)
RBC: 4.54 10*6/uL (ref 4.20–5.70)
RDW: 17.7 % — ABNORMAL HIGH (ref 11.1–15.7)
WBC: 4.9 10*3/uL (ref 4.0–10.0)

## 2015-04-04 LAB — TECHNOLOGIST REVIEW CHCC SATELLITE

## 2015-04-04 LAB — CMP (CANCER CENTER ONLY)
ALBUMIN: 3.4 g/dL (ref 3.3–5.5)
ALT(SGPT): 32 U/L (ref 10–47)
AST: 31 U/L (ref 11–38)
Alkaline Phosphatase: 116 U/L — ABNORMAL HIGH (ref 26–84)
BILIRUBIN TOTAL: 0.5 mg/dL (ref 0.20–1.60)
BUN: 9 mg/dL (ref 7–22)
CHLORIDE: 106 meq/L (ref 98–108)
CO2: 24 meq/L (ref 18–33)
CREATININE: 0.8 mg/dL (ref 0.6–1.2)
Calcium: 8.6 mg/dL (ref 8.0–10.3)
Glucose, Bld: 115 mg/dL (ref 73–118)
Potassium: 4.4 mEq/L (ref 3.3–4.7)
SODIUM: 144 meq/L (ref 128–145)
TOTAL PROTEIN: 6.6 g/dL (ref 6.4–8.1)

## 2015-04-04 LAB — LACTATE DEHYDROGENASE: LDH: 183 U/L (ref 125–245)

## 2015-04-04 MED ORDER — ZOLEDRONIC ACID 4 MG/100ML IV SOLN
4.0000 mg | Freq: Once | INTRAVENOUS | Status: AC
  Administered 2015-04-04: 4 mg via INTRAVENOUS
  Filled 2015-04-04: qty 100

## 2015-04-04 MED ORDER — DOXORUBICIN HCL CHEMO IV INJECTION 2 MG/ML
50.0000 mg/m2 | Freq: Once | INTRAVENOUS | Status: AC
  Administered 2015-04-04: 88 mg via INTRAVENOUS
  Filled 2015-04-04: qty 44

## 2015-04-04 MED ORDER — SODIUM CHLORIDE 0.9 % IV SOLN
Freq: Once | INTRAVENOUS | Status: AC
  Administered 2015-04-04: 10:00:00 via INTRAVENOUS

## 2015-04-04 MED ORDER — VINCRISTINE SULFATE CHEMO INJECTION 1 MG/ML
1.5000 mg | Freq: Once | INTRAVENOUS | Status: AC
  Administered 2015-04-04: 1.5 mg via INTRAVENOUS
  Filled 2015-04-04: qty 1.5

## 2015-04-04 MED ORDER — SODIUM CHLORIDE 0.9 % IV SOLN
Freq: Once | INTRAVENOUS | Status: AC
  Administered 2015-04-04: 10:00:00 via INTRAVENOUS
  Filled 2015-04-04: qty 8

## 2015-04-04 MED ORDER — SODIUM CHLORIDE 0.9 % IV SOLN
750.0000 mg/m2 | Freq: Once | INTRAVENOUS | Status: AC
  Administered 2015-04-04: 1320 mg via INTRAVENOUS
  Filled 2015-04-04: qty 66

## 2015-04-04 NOTE — Patient Instructions (Signed)
[facility] Discharge Instructions for Patients Receiving Chemotherapy  Today you received the following chemotherapy agents Adriamycin, Vincristine, Cytoxan and Zometa.   To help prevent nausea and vomiting after your treatment, we encourage you to take your nausea medication.   If you develop nausea and vomiting that is not controlled by your nausea medication, call the clinic.   BELOW ARE SYMPTOMS THAT SHOULD BE REPORTED IMMEDIATELY:  *FEVER GREATER THAN 100.5 F  *CHILLS WITH OR WITHOUT FEVER  NAUSEA AND VOMITING THAT IS NOT CONTROLLED WITH YOUR NAUSEA MEDICATION  *UNUSUAL SHORTNESS OF BREATH  *UNUSUAL BRUISING OR BLEEDING  TENDERNESS IN MOUTH AND THROAT WITH OR WITHOUT PRESENCE OF ULCERS  *URINARY PROBLEMS  *BOWEL PROBLEMS  UNUSUAL RASH Items with * indicate a potential emergency and should be followed up as soon as possible.  Feel free to call the clinic you have any questions or concerns. The clinic phone number is [phone].  Please show the [facility] at check-in to the Emergency Department and triage nurse.

## 2015-04-04 NOTE — Progress Notes (Signed)
Hematology and Oncology Follow Up Visit  Logan Mendoza [id number] December 06, 1950 64 y.o. 04/04/2015   Principle Diagnosis:   Anaplastic large cell non-Hodgkin's lymphoma-ALK (+)  Current Therapy:    Status post cycle 4 of CHOP  Zometa 4 mg IV every month     Interim History:  Logan Mendoza is back for follow-up. He looks quite good. He is still working without any difficulties. He is not complaining of any pain. He's had no problems with diarrhea or constipation. He might be a little bit more fatigued. He's had no problems with cough or shortness of breath. Actually, he did have a little bit of a cough for couple days. This was non-productive.  He's had no leg swelling. He's had no rashes.  He's had no fever.  He's had no mouth sores.  He is set up for a PET scan in early January.  Overall, his performance status is ECOG 1.  Medications:  Current outpatient prescriptions:  .  allopurinol (ZYLOPRIM) 100 MG tablet, Take 1 tablet (100 mg total) by mouth daily., Disp: 30 tablet, Rfl: 3 .  aspirin EC 81 MG tablet, Take 81 mg by mouth., Disp: , Rfl:  .  Cholecalciferol (VITAMIN D-1000 MAX ST) 1000 UNITS tablet, Take 2,000 Units by mouth 3 (three) times daily. , Disp: , Rfl:  .  co-enzyme Q-10 30 MG capsule, Take 200 mg by mouth daily. , Disp: , Rfl:  .  famciclovir (FAMVIR) 250 MG tablet, Take 1 tablet (250 mg total) by mouth daily., Disp: 30 tablet, Rfl: 8 .  finasteride (PROSCAR) 5 MG tablet, Take 5 mg by mouth daily., Disp: , Rfl:  .  flecainide (TAMBOCOR) 50 MG tablet, Take 50 mg by mouth 2 (two) times daily. , Disp: , Rfl:  .  GLUCOSAMINE HCL PO, Take 1 tablet by mouth daily. , Disp: , Rfl:  .  LORazepam (ATIVAN) 0.5 MG tablet, Take 1 tablet (0.5 mg total) by mouth every 6 (six) hours as needed (Nausea or vomiting)., Disp: 30 tablet, Rfl: 0 .  Milk Thistle 140 MG CAPS, Take 250 mg by mouth daily. , Disp: , Rfl:  .  NIFEdipine (PROCARDIA-XL/ADALAT-CC/NIFEDICAL-XL) 30 MG 24 hr  tablet, Take 30 mg by mouth., Disp: , Rfl:  .  ondansetron (ZOFRAN) 8 MG tablet, Take 1 tablet (8 mg total) by mouth 2 (two) times daily. Start the day after chemo for 3 days. Then as needed for nausea or vomiting., Disp: 30 tablet, Rfl: 1 .  Plant Sterols and Stanols (CHOLEST OFF PO), Take 300 mg by mouth daily. , Disp: , Rfl:  .  prochlorperazine (COMPAZINE) 10 MG tablet, Take 1 tablet (10 mg total) by mouth every 6 (six) hours as needed (Nausea or vomiting)., Disp: 30 tablet, Rfl: 6 .  Red Yeast Rice Extract (RED YEAST RICE PO), Take 600 mg by mouth 2 (two) times daily. , Disp: , Rfl:  .  saw palmetto 500 MG capsule, Take 450 mg by mouth daily., Disp: , Rfl:  .  testosterone enanthate (DELATESTRYL) 200 MG/ML injection, Inject into the muscle every 14 (fourteen) days. 4 ml every 2 weeks, Disp: , Rfl:   Allergies:  Allergies  Allergen Reactions  . Sulfa Antibiotics Hives  . Sulfa Drugs Cross Reactors Itching and Rash    Past Medical History, Surgical history, Social history, and Family History were reviewed and updated.  Review of Systems: As above  Physical Exam:  height is _0  (1.702 m) and weight is 144 lb (  65.318 kg). His oral temperature is 97.9 F (36.6 C). His blood pressure is 125/86 and his pulse is 83. His respiration is 16.   Wt Readings from Last 3 Encounters:  04/04/15 144 lb (65.318 kg)  03/14/15 144 lb (65.318 kg)  02/20/15 143 lb (64.864 kg)     Well-developed and well-nourished white gentleman head and neck exam shows no ocular lesions. There is no scleral icterus. He has no oral mucositis. There is no adenopathy in the neck. Lungs are clear bilaterally. Cardiac exam regular rate and rhythm with no murmurs, rubs or bruits. Abdomen is soft. Has good bowel sounds. There is no fluid wave. There is no palpable liver or spleen tip. Back exam shows no tenderness over the spine, ribs or hips. External shows no clubbing, cyanosis or edema. He has good range motion of his  joints. There is no tenderness over the hips bilaterally. Skin exam shows no rashes, ecchymoses or petechia. Neurological exam shows no focal neurological deficits.  Lab Results  Component Value Date   WBC 4.9 04/04/2015   HGB 12.9* 04/04/2015   HCT 39.0 04/04/2015   MCV 86 04/04/2015   PLT 366 04/04/2015     Chemistry      Component Value Date/Time   NA 144 04/04/2015 0901   NA 135 12/19/2014 2151   K 4.4 04/04/2015 0901   K 4.2 12/19/2014 2151   CL 106 04/04/2015 0901   CL 100* 12/19/2014 2151   CO2 24 04/04/2015 0901   CO2 25 12/19/2014 2151   BUN 9 04/04/2015 0901   BUN 18 12/19/2014 2151   CREATININE 0.8 04/04/2015 0901   CREATININE 0.77 12/19/2014 2151      Component Value Date/Time   CALCIUM 8.6 04/04/2015 0901   CALCIUM 9.3 12/19/2014 2151   ALKPHOS 116* 04/04/2015 0901   ALKPHOS 140* 12/10/2014 1415   AST 31 04/04/2015 0901   AST 14 12/10/2014 1415   ALT 32 04/04/2015 0901   ALT 10 12/10/2014 1415   BILITOT 0.50 04/04/2015 0901   BILITOT 0.3 12/10/2014 1415         Impression and Plan: Logan Mendoza is a 64 year old white male. He has anaplastic large cell non-Hodgkin's lymphoma. 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  We will go ahead and give him his fifth cycle of treatment. I will then plan for another PET scan on him.  I think the real issue is having cycles to give him. We will see what the next PET scan shows. We might be oh to get away with just 6 cycles.  He might need some radiation therapy to the affected areas. I will have to decide this.  We will plan to get him back in 3 weeks.   Volanda Napoleon, MD 12/22/20169:51 AM

## 2015-04-19 ENCOUNTER — Ambulatory Visit (HOSPITAL_COMMUNITY)
Admission: RE | Admit: 2015-04-19 | Discharge: 2015-04-19 | Disposition: A | Discharge status: Home / Self Care | Payer: Commercial Managed Care - PPO | Source: Ambulatory Visit | Attending: Hematology & Oncology | Admitting: Hematology & Oncology

## 2015-04-19 DIAGNOSIS — C8462 Anaplastic large cell lymphoma, ALK-positive, intrathoracic lymph nodes: Secondary | ICD-10-CM | POA: Diagnosis not present

## 2015-04-19 DIAGNOSIS — R918 Other nonspecific abnormal finding of lung field: Secondary | ICD-10-CM | POA: Insufficient documentation

## 2015-04-19 DIAGNOSIS — M899 Disorder of bone, unspecified: Secondary | ICD-10-CM | POA: Insufficient documentation

## 2015-04-19 DIAGNOSIS — I2510 Atherosclerotic heart disease of native coronary artery without angina pectoris: Secondary | ICD-10-CM | POA: Diagnosis not present

## 2015-04-19 DIAGNOSIS — N281 Cyst of kidney, acquired: Secondary | ICD-10-CM | POA: Diagnosis not present

## 2015-04-19 LAB — GLUCOSE, CAPILLARY: Glucose-Capillary: 101 mg/dL — ABNORMAL HIGH (ref 65–99)

## 2015-04-19 MED ORDER — FLUDEOXYGLUCOSE F - 18 (FDG) INJECTION
8.1800 | Freq: Once | INTRAVENOUS | Status: AC | PRN
  Administered 2015-04-19: 8.17 via INTRAVENOUS

## 2015-04-25 ENCOUNTER — Ambulatory Visit (HOSPITAL_BASED_OUTPATIENT_CLINIC_OR_DEPARTMENT_OTHER): Payer: Commercial Managed Care - PPO | Admitting: Hematology & Oncology

## 2015-04-25 ENCOUNTER — Other Ambulatory Visit (HOSPITAL_BASED_OUTPATIENT_CLINIC_OR_DEPARTMENT_OTHER): Payer: Commercial Managed Care - PPO

## 2015-04-25 ENCOUNTER — Ambulatory Visit (HOSPITAL_BASED_OUTPATIENT_CLINIC_OR_DEPARTMENT_OTHER): Payer: Commercial Managed Care - PPO

## 2015-04-25 ENCOUNTER — Encounter: Payer: Self-pay | Admitting: Hematology & Oncology

## 2015-04-25 VITALS — BP 122/70 | HR 81 | Temp 97.7°F | Resp 16 | Ht 67.0 in | Wt 143.0 lb

## 2015-04-25 DIAGNOSIS — C8460 Anaplastic large cell lymphoma, ALK-positive, unspecified site: Secondary | ICD-10-CM | POA: Diagnosis not present

## 2015-04-25 DIAGNOSIS — Z5111 Encounter for antineoplastic chemotherapy: Secondary | ICD-10-CM

## 2015-04-25 DIAGNOSIS — C8462 Anaplastic large cell lymphoma, ALK-positive, intrathoracic lymph nodes: Secondary | ICD-10-CM

## 2015-04-25 DIAGNOSIS — M899 Disorder of bone, unspecified: Secondary | ICD-10-CM

## 2015-04-25 LAB — CMP (CANCER CENTER ONLY)
ALBUMIN: 3.4 g/dL (ref 3.3–5.5)
ALT(SGPT): 31 U/L (ref 10–47)
AST: 31 U/L (ref 11–38)
Alkaline Phosphatase: 98 U/L — ABNORMAL HIGH (ref 26–84)
BUN, Bld: 10 mg/dL (ref 7–22)
CO2: 23 meq/L (ref 18–33)
CREATININE: 0.8 mg/dL (ref 0.6–1.2)
Calcium: 8.4 mg/dL (ref 8.0–10.3)
Chloride: 103 mEq/L (ref 98–108)
Glucose, Bld: 128 mg/dL — ABNORMAL HIGH (ref 73–118)
POTASSIUM: 4 meq/L (ref 3.3–4.7)
SODIUM: 139 meq/L (ref 128–145)
TOTAL PROTEIN: 6.4 g/dL (ref 6.4–8.1)
Total Bilirubin: 0.5 mg/dl (ref 0.20–1.60)

## 2015-04-25 LAB — CBC WITH DIFFERENTIAL (CANCER CENTER ONLY)
BASO#: 0 10*3/uL (ref 0.0–0.2)
BASO%: 0.6 % (ref 0.0–2.0)
EOS ABS: 0 10*3/uL (ref 0.0–0.5)
EOS%: 0.3 % (ref 0.0–7.0)
HEMATOCRIT: 36.5 % — AB (ref 38.7–49.9)
HEMOGLOBIN: 12.3 g/dL — AB (ref 13.0–17.1)
LYMPH#: 0.5 10*3/uL — ABNORMAL LOW (ref 0.9–3.3)
LYMPH%: 12.8 % — ABNORMAL LOW (ref 14.0–48.0)
MCH: 29.3 pg (ref 28.0–33.4)
MCHC: 33.7 g/dL (ref 32.0–35.9)
MCV: 87 fL (ref 82–98)
MONO#: 0.4 10*3/uL (ref 0.1–0.9)
MONO%: 12 % (ref 0.0–13.0)
NEUT#: 2.7 10*3/uL (ref 1.5–6.5)
NEUT%: 74.3 % (ref 40.0–80.0)
Platelets: 333 10*3/uL (ref 145–400)
RBC: 4.2 10*6/uL (ref 4.20–5.70)
RDW: 17.9 % — AB (ref 11.1–15.7)
WBC: 3.6 10*3/uL — ABNORMAL LOW (ref 4.0–10.0)

## 2015-04-25 LAB — LACTATE DEHYDROGENASE: LDH: 174 U/L (ref 125–245)

## 2015-04-25 MED ORDER — ZOLEDRONIC ACID 4 MG/100ML IV SOLN
4.0000 mg | Freq: Once | INTRAVENOUS | Status: AC
  Administered 2015-04-25: 4 mg via INTRAVENOUS
  Filled 2015-04-25: qty 100

## 2015-04-25 MED ORDER — SODIUM CHLORIDE 0.9 % IV SOLN
Freq: Once | INTRAVENOUS | Status: AC
  Administered 2015-04-25: 09:00:00 via INTRAVENOUS
  Filled 2015-04-25: qty 8

## 2015-04-25 MED ORDER — SODIUM CHLORIDE 0.9 % IV SOLN
Freq: Once | INTRAVENOUS | Status: AC
  Administered 2015-04-25: 09:00:00 via INTRAVENOUS

## 2015-04-25 MED ORDER — DOXORUBICIN HCL CHEMO IV INJECTION 2 MG/ML
50.0000 mg/m2 | Freq: Once | INTRAVENOUS | Status: AC
  Administered 2015-04-25: 88 mg via INTRAVENOUS
  Filled 2015-04-25: qty 44

## 2015-04-25 MED ORDER — SODIUM CHLORIDE 0.9 % IV SOLN
750.0000 mg/m2 | Freq: Once | INTRAVENOUS | Status: AC
  Administered 2015-04-25: 1320 mg via INTRAVENOUS
  Filled 2015-04-25: qty 66

## 2015-04-25 MED ORDER — VINCRISTINE SULFATE CHEMO INJECTION 1 MG/ML
1.5000 mg | Freq: Once | INTRAVENOUS | Status: AC
  Administered 2015-04-25: 1.5 mg via INTRAVENOUS
  Filled 2015-04-25: qty 1.5

## 2015-04-25 MED ORDER — ZOLEDRONIC ACID 4 MG/5ML IV CONC
4.0000 mg | Freq: Once | INTRAVENOUS | Status: DC
  Filled 2015-04-25: qty 5

## 2015-04-25 NOTE — Patient Instructions (Addendum)
[facility] Discharge Instructions for Patients Receiving Chemotherapy  Today you received the following chemotherapy agents Adriamycin, Vincristine and Cytoxan and Zometa.  To help prevent nausea and vomiting after your treatment, we encourage you to take your nausea medication.   If you develop nausea and vomiting that is not controlled by your nausea medication, call the clinic.   BELOW ARE SYMPTOMS THAT SHOULD BE REPORTED IMMEDIATELY:  *FEVER GREATER THAN 100.5 F  *CHILLS WITH OR WITHOUT FEVER  NAUSEA AND VOMITING THAT IS NOT CONTROLLED WITH YOUR NAUSEA MEDICATION  *UNUSUAL SHORTNESS OF BREATH  *UNUSUAL BRUISING OR BLEEDING  TENDERNESS IN MOUTH AND THROAT WITH OR WITHOUT PRESENCE OF ULCERS  *URINARY PROBLEMS  *BOWEL PROBLEMS  UNUSUAL RASH Items with * indicate a potential emergency and should be followed up as soon as possible.  Feel free to call the clinic you have any questions or concerns. The clinic phone number is [phone].  Please show the [facility] at check-in to the Emergency Department and triage nurse.

## 2015-04-25 NOTE — Progress Notes (Signed)
Hematology and Oncology Follow Up Visit  Logan Mendoza [id number] 1950-10-07 65 y.o. 04/25/2015   Principle Diagnosis:   Anaplastic large cell non-Hodgkin's lymphoma-ALK (+)  Current Therapy:    Status post cycle 5 of CHOP  Zometa 4 mg IV every month     Interim History:  Logan Mendoza is back for follow-up. He had very good Christmas and New Year's. He is still working. He try to exercise a little bit more. I told him that he can go back to the gym to him use weights if he would like.  He has not had any kind of bone pain.  We did go ahead and get a PET scan on him. This was done on January 6. PET scan showed continued improvement in his activity. There is minimal activity in the pelvic bones.  As such, I think he will need 8 cycle of treatment but also I think he will need radiation therapy. We will deal with the radiation after he gets done with chemotherapy.  He's had no mouth sores. He's had no nausea or vomiting. He's had no issues with bowels or bladder.   Overall, his performance status is ECOG 1.  Medications:  Current outpatient prescriptions:  .  allopurinol (ZYLOPRIM) 100 MG tablet, Take 1 tablet (100 mg total) by mouth daily., Disp: 30 tablet, Rfl: 3 .  aspirin EC 81 MG tablet, Take 81 mg by mouth., Disp: , Rfl:  .  Cholecalciferol (VITAMIN D-1000 MAX ST) 1000 UNITS tablet, Take 2,000 Units by mouth 3 (three) times daily. , Disp: , Rfl:  .  co-enzyme Q-10 30 MG capsule, Take 200 mg by mouth daily. , Disp: , Rfl:  .  famciclovir (FAMVIR) 250 MG tablet, Take 1 tablet (250 mg total) by mouth daily., Disp: 30 tablet, Rfl: 8 .  finasteride (PROSCAR) 5 MG tablet, Take 5 mg by mouth daily., Disp: , Rfl:  .  flecainide (TAMBOCOR) 50 MG tablet, Take 50 mg by mouth 2 (two) times daily. , Disp: , Rfl:  .  GLUCOSAMINE HCL PO, Take 1 tablet by mouth daily. , Disp: , Rfl:  .  LORazepam (ATIVAN) 0.5 MG tablet, Take 1 tablet (0.5 mg total) by mouth every 6 (six) hours as needed  (Nausea or vomiting)., Disp: 30 tablet, Rfl: 0 .  Milk Thistle 140 MG CAPS, Take 250 mg by mouth daily. , Disp: , Rfl:  .  NIFEdipine (PROCARDIA-XL/ADALAT-CC/NIFEDICAL-XL) 30 MG 24 hr tablet, Take 30 mg by mouth., Disp: , Rfl:  .  ondansetron (ZOFRAN) 8 MG tablet, Take 1 tablet (8 mg total) by mouth 2 (two) times daily. Start the day after chemo for 3 days. Then as needed for nausea or vomiting., Disp: 30 tablet, Rfl: 1 .  Plant Sterols and Stanols (CHOLEST OFF PO), Take 300 mg by mouth daily. , Disp: , Rfl:  .  prochlorperazine (COMPAZINE) 10 MG tablet, Take 1 tablet (10 mg total) by mouth every 6 (six) hours as needed (Nausea or vomiting)., Disp: 30 tablet, Rfl: 6 .  Red Yeast Rice Extract (RED YEAST RICE PO), Take 600 mg by mouth 2 (two) times daily. , Disp: , Rfl:  .  saw palmetto 500 MG capsule, Take 450 mg by mouth daily., Disp: , Rfl:  .  testosterone enanthate (DELATESTRYL) 200 MG/ML injection, Inject into the muscle every 14 (fourteen) days. 4 ml every 2 weeks, Disp: , Rfl:   Allergies:  Allergies  Allergen Reactions  . Sulfa Antibiotics Hives  . Sulfa  Drugs Cross Reactors Itching and Rash    Past Medical History, Surgical history, Social history, and Family History were reviewed and updated.  Review of Systems: As above  Physical Exam:  height is 5' 7" (1.702 m) and weight is 143 lb (64.864 kg). His oral temperature is 97.7 F (36.5 C). His blood pressure is 122/70 and his pulse is 81. His respiration is 16.   Wt Readings from Last 3 Encounters:  04/25/15 143 lb (64.864 kg)  04/04/15 144 lb (65.318 kg)  03/14/15 144 lb (65.318 kg)     Well-developed and well-nourished white gentleman head and neck exam shows no ocular lesions. There is no scleral icterus. He has no oral mucositis. There is no adenopathy in the neck. Lungs are clear bilaterally. Cardiac exam regular rate and rhythm with no murmurs, rubs or bruits. Abdomen is soft. Has good bowel sounds. There is no fluid  wave. There is no palpable liver or spleen tip. Back exam shows no tenderness over the spine, ribs or hips. External shows no clubbing, cyanosis or edema. He has good range motion of his joints. There is no tenderness over the hips bilaterally. Skin exam shows no rashes, ecchymoses or petechia. Neurological exam shows no focal neurological deficits.  Lab Results  Component Value Date   WBC 3.6* 04/25/2015   HGB 12.3* 04/25/2015   HCT 36.5* 04/25/2015   MCV 87 04/25/2015   PLT 333 04/25/2015     Chemistry      Component Value Date/Time   NA 144 04/04/2015 0901   NA 135 12/19/2014 2151   K 4.4 04/04/2015 0901   K 4.2 12/19/2014 2151   CL 106 04/04/2015 0901   CL 100* 12/19/2014 2151   CO2 24 04/04/2015 0901   CO2 25 12/19/2014 2151   BUN 9 04/04/2015 0901   BUN 18 12/19/2014 2151   CREATININE 0.8 04/04/2015 0901   CREATININE 0.77 12/19/2014 2151      Component Value Date/Time   CALCIUM 8.6 04/04/2015 0901   CALCIUM 9.3 12/19/2014 2151   ALKPHOS 116* 04/04/2015 0901   ALKPHOS 140* 12/10/2014 1415   AST 31 04/04/2015 0901   AST 14 12/10/2014 1415   ALT 32 04/04/2015 0901   ALT 10 12/10/2014 1415   BILITOT 0.50 04/04/2015 0901   BILITOT 0.3 12/10/2014 1415         Impression and Plan: Logan Mendoza is a 65 year old white male. He has anaplastic large cell non-Hodgkin's lymphoma. 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  We will go ahead and give him his 6th cycle of treatment.  All plan for a total of 8 cycles of treatment and then we will ahead with radiation.  He will get the Zometa.  I still feel that he is doing quite well. His response has been very satisfactory.  I spent about 30 minutes with him today.  We will plan to get him back in 3 weeks.   Volanda Napoleon, MD 1/12/20178:33 AM

## 2015-04-30 ENCOUNTER — Telehealth: Payer: Self-pay | Admitting: Hematology & Oncology

## 2015-04-30 NOTE — Telephone Encounter (Signed)
[employer] PRIOR AUTH APPROVED Auth: [id number] Valid: 04/14/2015 - 09/28/2015  Dx: T46.56 (ICD-10-CM) - Anaplastic large cell lymphoma, ALK-positive, intrathoracic lymph nodes   [id number] J2405 J1100 J9070 [id number] [id number] [id number] J9000  ID: [id number]   P: Missy O'Deil RN CCM, Clinical Nurse @ [phone] F: [phone]

## 2015-05-16 ENCOUNTER — Ambulatory Visit (HOSPITAL_BASED_OUTPATIENT_CLINIC_OR_DEPARTMENT_OTHER): Payer: Commercial Managed Care - PPO | Admitting: Hematology & Oncology

## 2015-05-16 ENCOUNTER — Encounter: Payer: Self-pay | Admitting: Hematology & Oncology

## 2015-05-16 ENCOUNTER — Other Ambulatory Visit (HOSPITAL_BASED_OUTPATIENT_CLINIC_OR_DEPARTMENT_OTHER): Payer: Commercial Managed Care - PPO

## 2015-05-16 ENCOUNTER — Ambulatory Visit (HOSPITAL_BASED_OUTPATIENT_CLINIC_OR_DEPARTMENT_OTHER): Payer: Commercial Managed Care - PPO

## 2015-05-16 VITALS — BP 110/76 | HR 92 | Temp 97.7°F | Resp 16 | Ht 67.0 in | Wt 142.0 lb

## 2015-05-16 DIAGNOSIS — C8462 Anaplastic large cell lymphoma, ALK-positive, intrathoracic lymph nodes: Secondary | ICD-10-CM | POA: Diagnosis not present

## 2015-05-16 DIAGNOSIS — M899 Disorder of bone, unspecified: Secondary | ICD-10-CM

## 2015-05-16 DIAGNOSIS — Z5111 Encounter for antineoplastic chemotherapy: Secondary | ICD-10-CM | POA: Diagnosis not present

## 2015-05-16 LAB — CBC WITH DIFFERENTIAL (CANCER CENTER ONLY)
BASO#: 0 10*3/uL (ref 0.0–0.2)
BASO%: 1 % (ref 0.0–2.0)
EOS ABS: 0 10*3/uL (ref 0.0–0.5)
EOS%: 0.7 % (ref 0.0–7.0)
HCT: 36.7 % — ABNORMAL LOW (ref 38.7–49.9)
HEMOGLOBIN: 12.3 g/dL — AB (ref 13.0–17.1)
LYMPH#: 0.5 10*3/uL — ABNORMAL LOW (ref 0.9–3.3)
LYMPH%: 16.4 % (ref 14.0–48.0)
MCH: 29.3 pg (ref 28.0–33.4)
MCHC: 33.5 g/dL (ref 32.0–35.9)
MCV: 87 fL (ref 82–98)
MONO#: 0.4 10*3/uL (ref 0.1–0.9)
MONO%: 13.7 % — ABNORMAL HIGH (ref 0.0–13.0)
NEUT%: 68.2 % (ref 40.0–80.0)
NEUTROS ABS: 2 10*3/uL (ref 1.5–6.5)
PLATELETS: 328 10*3/uL (ref 145–400)
RBC: 4.2 10*6/uL (ref 4.20–5.70)
RDW: 17.6 % — ABNORMAL HIGH (ref 11.1–15.7)
WBC: 2.9 10*3/uL — AB (ref 4.0–10.0)

## 2015-05-16 LAB — LACTATE DEHYDROGENASE: LDH: 173 U/L (ref 125–245)

## 2015-05-16 LAB — CMP (CANCER CENTER ONLY)
ALBUMIN: 3.3 g/dL (ref 3.3–5.5)
ALT(SGPT): 27 U/L (ref 10–47)
AST: 26 U/L (ref 11–38)
Alkaline Phosphatase: 99 U/L — ABNORMAL HIGH (ref 26–84)
BILIRUBIN TOTAL: 0.4 mg/dL (ref 0.20–1.60)
BUN: 13 mg/dL (ref 7–22)
CO2: 26 meq/L (ref 18–33)
CREATININE: 0.8 mg/dL (ref 0.6–1.2)
Calcium: 9.4 mg/dL (ref 8.0–10.3)
Chloride: 102 mEq/L (ref 98–108)
Glucose, Bld: 135 mg/dL — ABNORMAL HIGH (ref 73–118)
Potassium: 4.3 mEq/L (ref 3.3–4.7)
SODIUM: 139 meq/L (ref 128–145)
Total Protein: 6.7 g/dL (ref 6.4–8.1)

## 2015-05-16 MED ORDER — SODIUM CHLORIDE 0.9 % IV SOLN
Freq: Once | INTRAVENOUS | Status: AC
  Administered 2015-05-16: 09:00:00 via INTRAVENOUS

## 2015-05-16 MED ORDER — SODIUM CHLORIDE 0.9 % IV SOLN
Freq: Once | INTRAVENOUS | Status: AC
  Administered 2015-05-16: 09:00:00 via INTRAVENOUS
  Filled 2015-05-16: qty 8

## 2015-05-16 MED ORDER — VINCRISTINE SULFATE CHEMO INJECTION 1 MG/ML
1.5000 mg | Freq: Once | INTRAVENOUS | Status: AC
  Administered 2015-05-16: 1.5 mg via INTRAVENOUS
  Filled 2015-05-16: qty 1.5

## 2015-05-16 MED ORDER — DOXORUBICIN HCL CHEMO IV INJECTION 2 MG/ML
50.0000 mg/m2 | Freq: Once | INTRAVENOUS | Status: AC
  Administered 2015-05-16: 88 mg via INTRAVENOUS
  Filled 2015-05-16: qty 44

## 2015-05-16 MED ORDER — SODIUM CHLORIDE 0.9 % IV SOLN
750.0000 mg/m2 | Freq: Once | INTRAVENOUS | Status: AC
  Administered 2015-05-16: 1320 mg via INTRAVENOUS
  Filled 2015-05-16: qty 66

## 2015-05-16 MED ORDER — ZOLEDRONIC ACID 4 MG/100ML IV SOLN
4.0000 mg | Freq: Once | INTRAVENOUS | Status: AC
  Administered 2015-05-16: 4 mg via INTRAVENOUS
  Filled 2015-05-16: qty 100

## 2015-05-16 NOTE — Patient Instructions (Signed)
[facility] Discharge Instructions for Patients Receiving Chemotherapy  Today you received the following chemotherapy agents Adriamycin, Cytoxan, Vincristine  To help prevent nausea and vomiting after your treatment, we encourage you to take your nausea medication    If you develop nausea and vomiting that is not controlled by your nausea medication, call the clinic.   BELOW ARE SYMPTOMS THAT SHOULD BE REPORTED IMMEDIATELY:  *FEVER GREATER THAN 100.5 F  *CHILLS WITH OR WITHOUT FEVER  NAUSEA AND VOMITING THAT IS NOT CONTROLLED WITH YOUR NAUSEA MEDICATION  *UNUSUAL SHORTNESS OF BREATH  *UNUSUAL BRUISING OR BLEEDING  TENDERNESS IN MOUTH AND THROAT WITH OR WITHOUT PRESENCE OF ULCERS  *URINARY PROBLEMS  *BOWEL PROBLEMS  UNUSUAL RASH Items with * indicate a potential emergency and should be followed up as soon as possible.  Feel free to call the clinic you have any questions or concerns. The clinic phone number is [phone].  Please show the [facility] at check-in to the Emergency Department and triage nurse.

## 2015-05-16 NOTE — Progress Notes (Signed)
Hematology and Oncology Follow Up Visit  TELLER WAKEFIELD [id number] 10/19/1950 65 y.o. 05/16/2015   Principle Diagnosis:   Anaplastic large cell non-Hodgkin's lymphoma-ALK (+)  Current Therapy:    Status post cycle 6 of CHOP  Zometa 4 mg IV every month     Interim History:  Mr. Berrios is back for follow-up. He is doing quite well. He is working without difficulty. He's had no problems with nausea or vomiting. His appetite has been good. He's had no change in bowel or bladder habits. He's had no cough. He's had no shortness of breath. He's had no rashes. He's had no leg swelling. He's had no mouth sores.  Overall, his performance status is ECOG 1.  Medications:  Current outpatient prescriptions:  .  allopurinol (ZYLOPRIM) 100 MG tablet, Take 1 tablet (100 mg total) by mouth daily., Disp: 30 tablet, Rfl: 3 .  aspirin EC 81 MG tablet, Take 81 mg by mouth., Disp: , Rfl:  .  Cholecalciferol (VITAMIN D-1000 MAX ST) 1000 UNITS tablet, Take 2,000 Units by mouth 3 (three) times daily. , Disp: , Rfl:  .  ciprofloxacin (CIPRO) 500 MG tablet, TK 1 T PO  BID, Disp: , Rfl: 0 .  co-enzyme Q-10 30 MG capsule, Take 200 mg by mouth daily. , Disp: , Rfl:  .  famciclovir (FAMVIR) 250 MG tablet, Take 1 tablet (250 mg total) by mouth daily., Disp: 30 tablet, Rfl: 8 .  finasteride (PROSCAR) 5 MG tablet, Take 5 mg by mouth daily., Disp: , Rfl:  .  flecainide (TAMBOCOR) 50 MG tablet, Take 50 mg by mouth 2 (two) times daily. , Disp: , Rfl:  .  GLUCOSAMINE HCL PO, Take 1 tablet by mouth daily. , Disp: , Rfl:  .  LORazepam (ATIVAN) 0.5 MG tablet, Take 1 tablet (0.5 mg total) by mouth every 6 (six) hours as needed (Nausea or vomiting)., Disp: 30 tablet, Rfl: 0 .  Milk Thistle 140 MG CAPS, Take 250 mg by mouth daily. , Disp: , Rfl:  .  NIFEdipine (PROCARDIA-XL/ADALAT-CC/NIFEDICAL-XL) 30 MG 24 hr tablet, Take 30 mg by mouth., Disp: , Rfl:  .  ondansetron (ZOFRAN) 8 MG tablet, Take 1 tablet (8 mg total) by mouth 2  (two) times daily. Start the day after chemo for 3 days. Then as needed for nausea or vomiting., Disp: 30 tablet, Rfl: 1 .  Plant Sterols and Stanols (CHOLEST OFF PO), Take 300 mg by mouth daily. , Disp: , Rfl:  .  prochlorperazine (COMPAZINE) 10 MG tablet, Take 1 tablet (10 mg total) by mouth every 6 (six) hours as needed (Nausea or vomiting)., Disp: 30 tablet, Rfl: 6 .  Red Yeast Rice Extract (RED YEAST RICE PO), Take 600 mg by mouth 2 (two) times daily. , Disp: , Rfl:  .  saw palmetto 500 MG capsule, Take 450 mg by mouth daily., Disp: , Rfl:  .  testosterone enanthate (DELATESTRYL) 200 MG/ML injection, Inject into the muscle every 14 (fourteen) days. 4 ml every 2 weeks, Disp: , Rfl:   Allergies:  Allergies  Allergen Reactions  . Sulfa Antibiotics Hives  . Sulfa Drugs Cross Reactors Itching and Rash    Past Medical History, Surgical history, Social history, and Family History were reviewed and updated.  Review of Systems: As above  Physical Exam:  height is 5' 7"  (1.702 m) and weight is 142 lb (64.411 kg). His oral temperature is 97.7 F (36.5 C). His blood pressure is 110/76 and his pulse is 92.  His respiration is 16.   Wt Readings from Last 3 Encounters:  05/16/15 142 lb (64.411 kg)  04/25/15 143 lb (64.864 kg)  04/04/15 144 lb (65.318 kg)     Well-developed and well-nourished white gentleman head and neck exam shows no ocular lesions. There is no scleral icterus. He has no oral mucositis. There is no adenopathy in the neck. Lungs are clear bilaterally. Cardiac exam regular rate and rhythm with no murmurs, rubs or bruits. Abdomen is soft. Has good bowel sounds. There is no fluid wave. There is no palpable liver or spleen tip. Back exam shows no tenderness over the spine, ribs or hips. External shows no clubbing, cyanosis or edema. He has good range motion of his joints. There is no tenderness over the hips bilaterally. Skin exam shows no rashes, ecchymoses or petechia. Neurological  exam shows no focal neurological deficits.  Lab Results  Component Value Date   WBC 2.9* 05/16/2015   HGB 12.3* 05/16/2015   HCT 36.7* 05/16/2015   MCV 87 05/16/2015   PLT 328 05/16/2015     Chemistry      Component Value Date/Time   NA 139 05/16/2015 0749   NA 135 12/19/2014 2151   K 4.3 05/16/2015 0749   K 4.2 12/19/2014 2151   CL 102 05/16/2015 0749   CL 100* 12/19/2014 2151   CO2 26 05/16/2015 0749   CO2 25 12/19/2014 2151   BUN 13 05/16/2015 0749   BUN 18 12/19/2014 2151   CREATININE 0.8 05/16/2015 0749   CREATININE 0.77 12/19/2014 2151      Component Value Date/Time   CALCIUM 9.4 05/16/2015 0749   CALCIUM 9.3 12/19/2014 2151   ALKPHOS 99* 05/16/2015 0749   ALKPHOS 140* 12/10/2014 1415   AST 26 05/16/2015 0749   AST 14 12/10/2014 1415   ALT 27 05/16/2015 0749   ALT 10 12/10/2014 1415   BILITOT 0.40 05/16/2015 0749   BILITOT 0.3 12/10/2014 1415         Impression and Plan: Mr. Briles is a 65 year old white male. He has anaplastic large cell non-Hodgkin's lymphoma. 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  We will go ahead and give him his 7th cycle of treatment.  I will plan for a total of 8 cycles of treatment and then we will ahead with radiation. We talked about radiation. I told him that radiation is different for lymphoma that is for prostate cancer or most other cancers. The amount of radiation necessary is not nearly as much. I would not think that he would have any problems with radiation.  We will discuss radiation more after he finishes his chemotherapy.  He will get the Zometa.  I still feel that he is doing quite well. His response has been very satisfactory.  I spent about 30 minutes with him today.  We will plan to get him back in 3 weeks.   Volanda Napoleon, MD 2/2/20178:38 AM

## 2015-06-06 ENCOUNTER — Encounter: Payer: Self-pay | Admitting: Hematology & Oncology

## 2015-06-06 ENCOUNTER — Other Ambulatory Visit (HOSPITAL_BASED_OUTPATIENT_CLINIC_OR_DEPARTMENT_OTHER): Payer: Commercial Managed Care - PPO

## 2015-06-06 ENCOUNTER — Ambulatory Visit (HOSPITAL_BASED_OUTPATIENT_CLINIC_OR_DEPARTMENT_OTHER): Payer: Commercial Managed Care - PPO | Admitting: Hematology & Oncology

## 2015-06-06 ENCOUNTER — Ambulatory Visit (HOSPITAL_BASED_OUTPATIENT_CLINIC_OR_DEPARTMENT_OTHER): Payer: Commercial Managed Care - PPO

## 2015-06-06 ENCOUNTER — Telehealth: Payer: Self-pay | Admitting: Hematology & Oncology

## 2015-06-06 VITALS — BP 111/71 | HR 88 | Temp 97.5°F | Resp 16 | Ht 67.0 in | Wt 141.0 lb

## 2015-06-06 DIAGNOSIS — C8460 Anaplastic large cell lymphoma, ALK-positive, unspecified site: Secondary | ICD-10-CM

## 2015-06-06 DIAGNOSIS — Z5111 Encounter for antineoplastic chemotherapy: Secondary | ICD-10-CM | POA: Diagnosis not present

## 2015-06-06 DIAGNOSIS — C8462 Anaplastic large cell lymphoma, ALK-positive, intrathoracic lymph nodes: Secondary | ICD-10-CM

## 2015-06-06 LAB — CBC WITH DIFFERENTIAL (CANCER CENTER ONLY)
BASO#: 0 10*3/uL (ref 0.0–0.2)
BASO%: 0.8 % (ref 0.0–2.0)
EOS%: 0.8 % (ref 0.0–7.0)
Eosinophils Absolute: 0 10*3/uL (ref 0.0–0.5)
HEMATOCRIT: 32.6 % — AB (ref 38.7–49.9)
HEMOGLOBIN: 11 g/dL — AB (ref 13.0–17.1)
LYMPH#: 0.4 10*3/uL — ABNORMAL LOW (ref 0.9–3.3)
LYMPH%: 15.1 % (ref 14.0–48.0)
MCH: 29.9 pg (ref 28.0–33.4)
MCHC: 33.7 g/dL (ref 32.0–35.9)
MCV: 89 fL (ref 82–98)
MONO#: 0.3 10*3/uL (ref 0.1–0.9)
MONO%: 13.9 % — AB (ref 0.0–13.0)
NEUT#: 1.7 10*3/uL (ref 1.5–6.5)
NEUT%: 69.4 % (ref 40.0–80.0)
Platelets: 249 10*3/uL (ref 145–400)
RBC: 3.68 10*6/uL — ABNORMAL LOW (ref 4.20–5.70)
RDW: 17 % — ABNORMAL HIGH (ref 11.1–15.7)
WBC: 2.4 10*3/uL — AB (ref 4.0–10.0)

## 2015-06-06 LAB — CMP (CANCER CENTER ONLY)
ALBUMIN: 3.2 g/dL — AB (ref 3.3–5.5)
ALT(SGPT): 28 U/L (ref 10–47)
AST: 33 U/L (ref 11–38)
Alkaline Phosphatase: 93 U/L — ABNORMAL HIGH (ref 26–84)
BILIRUBIN TOTAL: 0.4 mg/dL (ref 0.20–1.60)
BUN, Bld: 13 mg/dL (ref 7–22)
CALCIUM: 9.1 mg/dL (ref 8.0–10.3)
CHLORIDE: 104 meq/L (ref 98–108)
CO2: 25 mEq/L (ref 18–33)
Creat: 0.6 mg/dl (ref 0.6–1.2)
Glucose, Bld: 148 mg/dL — ABNORMAL HIGH (ref 73–118)
Potassium: 3.8 mEq/L (ref 3.3–4.7)
Sodium: 137 mEq/L (ref 128–145)
TOTAL PROTEIN: 6.4 g/dL (ref 6.4–8.1)

## 2015-06-06 LAB — LACTATE DEHYDROGENASE: LDH: 176 U/L (ref 125–245)

## 2015-06-06 MED ORDER — ZOLEDRONIC ACID 4 MG/100ML IV SOLN
4.0000 mg | Freq: Once | INTRAVENOUS | Status: AC
  Administered 2015-06-06: 4 mg via INTRAVENOUS
  Filled 2015-06-06: qty 100

## 2015-06-06 MED ORDER — SODIUM CHLORIDE 0.9 % IV SOLN
Freq: Once | INTRAVENOUS | Status: AC
  Administered 2015-06-06: 09:00:00 via INTRAVENOUS

## 2015-06-06 MED ORDER — SODIUM CHLORIDE 0.9 % IV SOLN
Freq: Once | INTRAVENOUS | Status: AC
  Administered 2015-06-06: 09:00:00 via INTRAVENOUS
  Filled 2015-06-06: qty 8

## 2015-06-06 MED ORDER — SODIUM CHLORIDE 0.9 % IV SOLN
750.0000 mg/m2 | Freq: Once | INTRAVENOUS | Status: AC
  Administered 2015-06-06: 1320 mg via INTRAVENOUS
  Filled 2015-06-06: qty 66

## 2015-06-06 MED ORDER — DOXORUBICIN HCL CHEMO IV INJECTION 2 MG/ML
50.0000 mg/m2 | Freq: Once | INTRAVENOUS | Status: AC
  Administered 2015-06-06: 88 mg via INTRAVENOUS
  Filled 2015-06-06: qty 44

## 2015-06-06 MED ORDER — VINCRISTINE SULFATE CHEMO INJECTION 1 MG/ML
1.5000 mg | Freq: Once | INTRAVENOUS | Status: AC
  Administered 2015-06-06: 1.5 mg via INTRAVENOUS
  Filled 2015-06-06: qty 1.5

## 2015-06-06 NOTE — Telephone Encounter (Signed)
Called patient and notified him of PET Scan appt.        [facility].

## 2015-06-06 NOTE — Patient Instructions (Signed)
[facility] Discharge Instructions for Patients Receiving Chemotherapy  Today you received the following chemotherapy agents Adriamycin, Vincristine and Cytoxan and Zometa.   To help prevent nausea and vomiting after your treatment, we encourage you to take your nausea medication.   If you develop nausea and vomiting that is not controlled by your nausea medication, call the clinic.   BELOW ARE SYMPTOMS THAT SHOULD BE REPORTED IMMEDIATELY:  *FEVER GREATER THAN 100.5 F  *CHILLS WITH OR WITHOUT FEVER  NAUSEA AND VOMITING THAT IS NOT CONTROLLED WITH YOUR NAUSEA MEDICATION  *UNUSUAL SHORTNESS OF BREATH  *UNUSUAL BRUISING OR BLEEDING  TENDERNESS IN MOUTH AND THROAT WITH OR WITHOUT PRESENCE OF ULCERS  *URINARY PROBLEMS  *BOWEL PROBLEMS  UNUSUAL RASH Items with * indicate a potential emergency and should be followed up as soon as possible.  Feel free to call the clinic you have any questions or concerns. The clinic phone number is [phone].  Please show the [facility] at check-in to the Emergency Department and triage nurse.

## 2015-06-06 NOTE — Progress Notes (Signed)
Hematology and Oncology Follow Up Visit  Logan Mendoza [id number] August 04, 1950 65 y.o. 06/06/2015   Principle Diagnosis:   Anaplastic large cell non-Hodgkin's lymphoma-ALK (+)  Current Therapy:    Status post cycle 7 of CHOP  Zometa 4 mg IV every month     Interim History:  Logan Mendoza is back for follow-up. He is doing quite well. He is working without difficulty. He's had no problems with nausea or vomiting. His appetite has been good. He's had no change in bowel or bladder habits. He's had no cough. He's had no shortness of breath. He's had no rashes. He's had no leg swelling. He's had no mouth sores.  Overall, his performance status is ECOG 1.  Medications:  Current outpatient prescriptions:  .  aspirin EC 81 MG tablet, Take 81 mg by mouth., Disp: , Rfl:  .  Cholecalciferol (VITAMIN D-1000 MAX ST) 1000 UNITS tablet, Take 2,000 Units by mouth 3 (three) times daily. , Disp: , Rfl:  .  co-enzyme Q-10 30 MG capsule, Take 200 mg by mouth daily. , Disp: , Rfl:  .  finasteride (PROSCAR) 5 MG tablet, Take 5 mg by mouth daily., Disp: , Rfl:  .  flecainide (TAMBOCOR) 50 MG tablet, Take 50 mg by mouth 2 (two) times daily. , Disp: , Rfl:  .  GLUCOSAMINE HCL PO, Take 1 tablet by mouth daily. , Disp: , Rfl:  .  Milk Thistle 140 MG CAPS, Take 250 mg by mouth daily. , Disp: , Rfl:  .  NIFEdipine (PROCARDIA-XL/ADALAT-CC/NIFEDICAL-XL) 30 MG 24 hr tablet, Take 30 mg by mouth., Disp: , Rfl:  .  Plant Sterols and Stanols (CHOLEST OFF PO), Take 300 mg by mouth daily. , Disp: , Rfl:  .  Red Yeast Rice Extract (RED YEAST RICE PO), Take 600 mg by mouth 2 (two) times daily. , Disp: , Rfl:  .  saw palmetto 500 MG capsule, Take 450 mg by mouth daily., Disp: , Rfl:  .  testosterone enanthate (DELATESTRYL) 200 MG/ML injection, Inject into the muscle every 14 (fourteen) days. 4 ml every 2 weeks, Disp: , Rfl:  .  famciclovir (FAMVIR) 250 MG tablet, Take 1 tablet (250 mg total) by mouth daily. (Patient not  taking: Reported on 06/06/2015), Disp: 30 tablet, Rfl: 8 .  LORazepam (ATIVAN) 0.5 MG tablet, Take 1 tablet (0.5 mg total) by mouth every 6 (six) hours as needed (Nausea or vomiting). (Patient not taking: Reported on 06/06/2015), Disp: 30 tablet, Rfl: 0 .  ondansetron (ZOFRAN) 8 MG tablet, Take 1 tablet (8 mg total) by mouth 2 (two) times daily. Start the day after chemo for 3 days. Then as needed for nausea or vomiting. (Patient not taking: Reported on 06/06/2015), Disp: 30 tablet, Rfl: 1 .  prochlorperazine (COMPAZINE) 10 MG tablet, Take 1 tablet (10 mg total) by mouth every 6 (six) hours as needed (Nausea or vomiting). (Patient not taking: Reported on 06/06/2015), Disp: 30 tablet, Rfl: 6  Allergies:  Allergies  Allergen Reactions  . Sulfa Antibiotics Hives  . Sulfa Drugs Cross Reactors Itching and Rash    Past Medical History, Surgical history, Social history, and Family History were reviewed and updated.  Review of Systems: As above  Physical Exam:  height is 5' 7"  (1.702 m) and weight is 141 lb (63.957 kg). His oral temperature is 97.5 F (36.4 C). His blood pressure is 111/71 and his pulse is 88. His respiration is 16.   Wt Readings from Last 3 Encounters:  06/06/15  141 lb (63.957 kg)  05/16/15 142 lb (64.411 kg)  04/25/15 143 lb (64.864 kg)     Well-developed and well-nourished white gentleman head and neck exam shows no ocular lesions. There is no scleral icterus. He has no oral mucositis. There is no adenopathy in the neck. Lungs are clear bilaterally. Cardiac exam regular rate and rhythm with no murmurs, rubs or bruits. Abdomen is soft. Has good bowel sounds. There is no fluid wave. There is no palpable liver or spleen tip. Back exam shows no tenderness over the spine, ribs or hips. External shows no clubbing, cyanosis or edema. He has good range motion of his joints. There is no tenderness over the hips bilaterally. Skin exam shows no rashes, ecchymoses or petechia. Neurological  exam shows no focal neurological deficits.  Lab Results  Component Value Date   WBC 2.4* 06/06/2015   HGB 11.0* 06/06/2015   HCT 32.6* 06/06/2015   MCV 89 06/06/2015   PLT 249 06/06/2015     Chemistry      Component Value Date/Time   NA 139 05/16/2015 0749   NA 135 12/19/2014 2151   K 4.3 05/16/2015 0749   K 4.2 12/19/2014 2151   CL 102 05/16/2015 0749   CL 100* 12/19/2014 2151   CO2 26 05/16/2015 0749   CO2 25 12/19/2014 2151   BUN 13 05/16/2015 0749   BUN 18 12/19/2014 2151   CREATININE 0.8 05/16/2015 0749   CREATININE 0.77 12/19/2014 2151      Component Value Date/Time   CALCIUM 9.4 05/16/2015 0749   CALCIUM 9.3 12/19/2014 2151   ALKPHOS 99* 05/16/2015 0749   ALKPHOS 140* 12/10/2014 1415   AST 26 05/16/2015 0749   AST 14 12/10/2014 1415   ALT 27 05/16/2015 0749   ALT 10 12/10/2014 1415   BILITOT 0.40 05/16/2015 0749   BILITOT 0.3 12/10/2014 1415         Impression and Plan: Logan Mendoza is a 65 year old white male. He has anaplastic large cell non-Hodgkin's lymphoma. 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  We will go ahead and give him his 8th cycle of treatment.  I will plan for a total of 8 cycles of treatment and then we will move ahead with radiation. We talked about radiation. I told him that radiation is different for lymphoma that is for prostate cancer or most other cancers. The amount of radiation necessary is not nearly as much. I would not think that he would have any problems with radiation.  I will not consideration for him by for about 6 weeks.  We'll set up a PET scan on him in about 4-5 weeks.  He will get the Zometa. Once we finish the chemotherapy, we might go to get him on Xgeva. I did this would be very reasonable.  I spent about 30 minutes with him today.  We will plan to get him back in 3 weeks.   Volanda Napoleon, MD 2/23/20178:27 AM

## 2015-06-27 ENCOUNTER — Ambulatory Visit: Payer: Commercial Managed Care - PPO

## 2015-06-27 ENCOUNTER — Other Ambulatory Visit (HOSPITAL_BASED_OUTPATIENT_CLINIC_OR_DEPARTMENT_OTHER): Payer: Commercial Managed Care - PPO

## 2015-06-27 ENCOUNTER — Encounter: Payer: Self-pay | Admitting: Hematology & Oncology

## 2015-06-27 ENCOUNTER — Ambulatory Visit (HOSPITAL_BASED_OUTPATIENT_CLINIC_OR_DEPARTMENT_OTHER): Payer: Commercial Managed Care - PPO | Admitting: Hematology & Oncology

## 2015-06-27 VITALS — BP 103/68 | HR 91 | Temp 97.7°F | Resp 18 | Ht 67.0 in | Wt 142.0 lb

## 2015-06-27 DIAGNOSIS — C8462 Anaplastic large cell lymphoma, ALK-positive, intrathoracic lymph nodes: Secondary | ICD-10-CM | POA: Diagnosis not present

## 2015-06-27 LAB — CBC WITH DIFFERENTIAL (CANCER CENTER ONLY)
BASO#: 0 10*3/uL (ref 0.0–0.2)
BASO%: 1.2 % (ref 0.0–2.0)
EOS%: 0.9 % (ref 0.0–7.0)
Eosinophils Absolute: 0 10*3/uL (ref 0.0–0.5)
HCT: 31.8 % — ABNORMAL LOW (ref 38.7–49.9)
HGB: 10.8 g/dL — ABNORMAL LOW (ref 13.0–17.1)
LYMPH#: 0.5 10*3/uL — ABNORMAL LOW (ref 0.9–3.3)
LYMPH%: 13.6 % — AB (ref 14.0–48.0)
MCH: 30.7 pg (ref 28.0–33.4)
MCHC: 34 g/dL (ref 32.0–35.9)
MCV: 90 fL (ref 82–98)
MONO#: 0.6 10*3/uL (ref 0.1–0.9)
MONO%: 18.1 % — ABNORMAL HIGH (ref 0.0–13.0)
NEUT#: 2.2 10*3/uL (ref 1.5–6.5)
NEUT%: 66.2 % (ref 40.0–80.0)
PLATELETS: 247 10*3/uL (ref 145–400)
RBC: 3.52 10*6/uL — ABNORMAL LOW (ref 4.20–5.70)
RDW: 17.5 % — AB (ref 11.1–15.7)
WBC: 3.3 10*3/uL — ABNORMAL LOW (ref 4.0–10.0)

## 2015-06-27 LAB — CMP (CANCER CENTER ONLY)
ALT(SGPT): 24 U/L (ref 10–47)
AST: 31 U/L (ref 11–38)
Albumin: 3.3 g/dL (ref 3.3–5.5)
Alkaline Phosphatase: 118 U/L — ABNORMAL HIGH (ref 26–84)
BILIRUBIN TOTAL: 0.5 mg/dL (ref 0.20–1.60)
BUN: 15 mg/dL (ref 7–22)
CALCIUM: 9 mg/dL (ref 8.0–10.3)
CO2: 25 meq/L (ref 18–33)
Chloride: 104 mEq/L (ref 98–108)
Creat: 0.9 mg/dl (ref 0.6–1.2)
GLUCOSE: 102 mg/dL (ref 73–118)
Potassium: 3.8 mEq/L (ref 3.3–4.7)
SODIUM: 136 meq/L (ref 128–145)
Total Protein: 6.4 g/dL (ref 6.4–8.1)

## 2015-06-27 LAB — LACTATE DEHYDROGENASE: LDH: 176 U/L (ref 125–245)

## 2015-06-27 NOTE — Progress Notes (Signed)
Hematology and Oncology Follow Up Visit  Logan Mendoza [id number] 23-Aug-1950 65 y.o. 06/27/2015   Principle Diagnosis:   Anaplastic large cell non-Hodgkin's lymphoma-ALK (+)  Current Therapy:    Status post cycle 8 of CHOP  Zometa 4 mg IV every month     Interim History:  Logan Mendoza is back for a follow-up. This actually was an early appointment for him. He is feeling well. He tolerated his 8 cycles of treatment in [place] well.  He is not due for his PET scan for another 2 or 3 weeks.  He's had no bony pain. He's had no problems with bowels or bladder. He's had no nausea or vomiting. He's had no rashes. He has had a Labella cough. This poses at nighttime.  He's had no fever. There's been no bleeding.  He is still working. He's tried exercise a little bit more.  Overall, his performance status is ECOG 1.  Medications:  Current outpatient prescriptions:  .  aspirin EC 81 MG tablet, Take 81 mg by mouth., Disp: , Rfl:  .  Cholecalciferol (VITAMIN D-1000 MAX ST) 1000 UNITS tablet, Take 2,000 Units by mouth 3 (three) times daily. , Disp: , Rfl:  .  co-enzyme Q-10 30 MG capsule, Take 200 mg by mouth daily. , Disp: , Rfl:  .  famciclovir (FAMVIR) 250 MG tablet, Take 1 tablet (250 mg total) by mouth daily., Disp: 30 tablet, Rfl: 8 .  finasteride (PROSCAR) 5 MG tablet, Take 5 mg by mouth daily., Disp: , Rfl:  .  flecainide (TAMBOCOR) 50 MG tablet, Take 50 mg by mouth 2 (two) times daily. , Disp: , Rfl:  .  GLUCOSAMINE HCL PO, Take 1 tablet by mouth daily. , Disp: , Rfl:  .  Milk Thistle 140 MG CAPS, Take 250 mg by mouth daily. , Disp: , Rfl:  .  NIFEdipine (PROCARDIA-XL/ADALAT-CC/NIFEDICAL-XL) 30 MG 24 hr tablet, Take 30 mg by mouth., Disp: , Rfl:  .  Plant Sterols and Stanols (CHOLEST OFF PO), Take 300 mg by mouth daily. , Disp: , Rfl:  .  Red Yeast Rice Extract (RED YEAST RICE PO), Take 600 mg by mouth 2 (two) times daily. , Disp: , Rfl:  .  saw palmetto 500 MG capsule, Take 450 mg  by mouth daily., Disp: , Rfl:  .  testosterone enanthate (DELATESTRYL) 200 MG/ML injection, Inject into the muscle every 14 (fourteen) days. 4 ml every 2 weeks, Disp: , Rfl:  .  LORazepam (ATIVAN) 0.5 MG tablet, Take 1 tablet (0.5 mg total) by mouth every 6 (six) hours as needed (Nausea or vomiting). (Patient not taking: Reported on 06/06/2015), Disp: 30 tablet, Rfl: 0 .  ondansetron (ZOFRAN) 8 MG tablet, Take 1 tablet (8 mg total) by mouth 2 (two) times daily. Start the day after chemo for 3 days. Then as needed for nausea or vomiting. (Patient not taking: Reported on 06/06/2015), Disp: 30 tablet, Rfl: 1 .  prochlorperazine (COMPAZINE) 10 MG tablet, Take 1 tablet (10 mg total) by mouth every 6 (six) hours as needed (Nausea or vomiting). (Patient not taking: Reported on 06/06/2015), Disp: 30 tablet, Rfl: 6  Allergies:  Allergies  Allergen Reactions  . Sulfa Antibiotics Hives  . Sulfa Drugs Cross Reactors Itching and Rash    Past Medical History, Surgical history, Social history, and Family History were reviewed and updated.  Review of Systems: As above  Physical Exam:  height is 5' 7"  (1.702 m) and weight is 142 lb (64.411 kg). His  oral temperature is 97.7 F (36.5 C). His blood pressure is 103/68 and his pulse is 91. His respiration is 18.   Wt Readings from Last 3 Encounters:  06/27/15 142 lb (64.411 kg)  06/06/15 141 lb (63.957 kg)  05/16/15 142 lb (64.411 kg)     Well-developed and well-nourished white gentleman head and neck exam shows no ocular lesions. There is no scleral icterus. He has no oral mucositis. There is no adenopathy in the neck. Lungs are clear bilaterally. Cardiac exam regular rate and rhythm with no murmurs, rubs or bruits. Abdomen is soft. Has good bowel sounds. There is no fluid wave. There is no palpable liver or spleen tip. Back exam shows no tenderness over the spine, ribs or hips. External shows no clubbing, cyanosis or edema. He has good range motion of his  joints. There is no tenderness over the hips bilaterally. Skin exam shows no rashes, ecchymoses or petechia. Neurological exam shows no focal neurological deficits.  Lab Results  Component Value Date   WBC 3.3* 06/27/2015   HGB 10.8* 06/27/2015   HCT 31.8* 06/27/2015   MCV 90 06/27/2015   PLT 247 06/27/2015     Chemistry      Component Value Date/Time   NA 136 06/27/2015 0801   NA 135 12/19/2014 2151   K 3.8 06/27/2015 0801   K 4.2 12/19/2014 2151   CL 104 06/27/2015 0801   CL 100* 12/19/2014 2151   CO2 25 06/27/2015 0801   CO2 25 12/19/2014 2151   BUN 15 06/27/2015 0801   BUN 18 12/19/2014 2151   CREATININE 0.9 06/27/2015 0801   CREATININE 0.77 12/19/2014 2151      Component Value Date/Time   CALCIUM 9.0 06/27/2015 0801   CALCIUM 9.3 12/19/2014 2151   ALKPHOS 118* 06/27/2015 0801   ALKPHOS 140* 12/10/2014 1415   AST 31 06/27/2015 0801   AST 14 12/10/2014 1415   ALT 24 06/27/2015 0801   ALT 10 12/10/2014 1415   BILITOT 0.50 06/27/2015 0801   BILITOT 0.3 12/10/2014 1415         Impression and Plan: Logan Mendoza is a 65 year old white male. He has anaplastic large cell non-Hodgkin's lymphoma. 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  We'll set up a PET scan on him in about 2-3 weeks.  He will get the Zometa. Once we finish the chemotherapy, we might go to get him on Xgeva. I think this would be very reasonable.  I spent about 30 minutes with him today.  We will plan to get him back in 3 weeks.  I still think that radiation therapy would not be a bad idea for him. 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 I think this would be reasonable to try to eradicate any micrometastatic disease. I'll have speak with radiation oncology once we get the PET scan results back.    Logan Napoleon, MD 3/16/20179:01 AM

## 2015-06-27 NOTE — Progress Notes (Signed)
Patient does not need treatment today

## 2015-07-10 ENCOUNTER — Other Ambulatory Visit: Payer: Self-pay | Admitting: *Deleted

## 2015-07-10 DIAGNOSIS — C8462 Anaplastic large cell lymphoma, ALK-positive, intrathoracic lymph nodes: Secondary | ICD-10-CM

## 2015-07-10 MED ORDER — FAMCICLOVIR 250 MG PO TABS: 250.0000 mg | ORAL_TABLET | Freq: Every day | ORAL | Status: DC

## 2015-07-11 ENCOUNTER — Ambulatory Visit (HOSPITAL_COMMUNITY): Payer: Commercial Managed Care - PPO

## 2015-07-17 ENCOUNTER — Other Ambulatory Visit: Payer: Self-pay | Admitting: *Deleted

## 2015-07-17 DIAGNOSIS — C8462 Anaplastic large cell lymphoma, ALK-positive, intrathoracic lymph nodes: Secondary | ICD-10-CM

## 2015-07-18 ENCOUNTER — Other Ambulatory Visit: Payer: Commercial Managed Care - PPO

## 2015-07-18 ENCOUNTER — Other Ambulatory Visit (HOSPITAL_BASED_OUTPATIENT_CLINIC_OR_DEPARTMENT_OTHER): Payer: Commercial Managed Care - PPO

## 2015-07-18 ENCOUNTER — Ambulatory Visit: Payer: Commercial Managed Care - PPO

## 2015-07-18 ENCOUNTER — Ambulatory Visit (HOSPITAL_BASED_OUTPATIENT_CLINIC_OR_DEPARTMENT_OTHER): Payer: Commercial Managed Care - PPO | Admitting: Hematology & Oncology

## 2015-07-18 ENCOUNTER — Encounter: Payer: Self-pay | Admitting: Hematology & Oncology

## 2015-07-18 ENCOUNTER — Ambulatory Visit: Payer: Commercial Managed Care - PPO | Admitting: Hematology & Oncology

## 2015-07-18 VITALS — BP 100/62 | HR 83 | Temp 98.0°F | Resp 16 | Ht 67.0 in | Wt 143.0 lb

## 2015-07-18 DIAGNOSIS — M810 Age-related osteoporosis without current pathological fracture: Secondary | ICD-10-CM

## 2015-07-18 DIAGNOSIS — C8462 Anaplastic large cell lymphoma, ALK-positive, intrathoracic lymph nodes: Secondary | ICD-10-CM

## 2015-07-18 LAB — CBC WITH DIFFERENTIAL (CANCER CENTER ONLY)
BASO#: 0 10*3/uL (ref 0.0–0.2)
BASO%: 0.2 % (ref 0.0–2.0)
EOS ABS: 0.2 10*3/uL (ref 0.0–0.5)
EOS%: 4.3 % (ref 0.0–7.0)
HEMATOCRIT: 37.2 % — AB (ref 38.7–49.9)
HEMOGLOBIN: 12.7 g/dL — AB (ref 13.0–17.1)
LYMPH#: 0.5 10*3/uL — AB (ref 0.9–3.3)
LYMPH%: 10.6 % — ABNORMAL LOW (ref 14.0–48.0)
MCH: 31.1 pg (ref 28.0–33.4)
MCHC: 34.1 g/dL (ref 32.0–35.9)
MCV: 91 fL (ref 82–98)
MONO#: 0.4 10*3/uL (ref 0.1–0.9)
MONO%: 8.4 % (ref 0.0–13.0)
NEUT%: 76.5 % (ref 40.0–80.0)
NEUTROS ABS: 3.7 10*3/uL (ref 1.5–6.5)
Platelets: 160 10*3/uL (ref 145–400)
RBC: 4.08 10*6/uL — ABNORMAL LOW (ref 4.20–5.70)
RDW: 17.4 % — AB (ref 11.1–15.7)
WBC: 4.9 10*3/uL (ref 4.0–10.0)

## 2015-07-18 LAB — COMPREHENSIVE METABOLIC PANEL
ALBUMIN: 3.8 g/dL (ref 3.5–5.0)
ALK PHOS: 96 U/L (ref 40–150)
ALT: 22 U/L (ref 0–55)
ANION GAP: 8 meq/L (ref 3–11)
AST: 25 U/L (ref 5–34)
BILIRUBIN TOTAL: 0.44 mg/dL (ref 0.20–1.20)
BUN: 15.2 mg/dL (ref 7.0–26.0)
CO2: 23 mEq/L (ref 22–29)
Calcium: 8.9 mg/dL (ref 8.4–10.4)
Chloride: 107 mEq/L (ref 98–109)
Creatinine: 0.8 mg/dL (ref 0.7–1.3)
Glucose: 99 mg/dl (ref 70–140)
POTASSIUM: 4.1 meq/L (ref 3.5–5.1)
Sodium: 138 mEq/L (ref 136–145)
TOTAL PROTEIN: 6.6 g/dL (ref 6.4–8.3)

## 2015-07-18 LAB — LACTATE DEHYDROGENASE: LDH: 174 U/L (ref 125–245)

## 2015-07-18 NOTE — Progress Notes (Signed)
Hematology and Oncology Follow Up Visit  Logan Mendoza [id number] [birth year] 65 y.o. 07/18/2015   Principle Diagnosis:   Anaplastic large cell non-Hodgkin's lymphoma-ALK (+)  Current Therapy:    Status post cycle 8 of CHOP  Xgeva 120gm sq q 3 months - start 08/2015     Interim History:  Logan Mendoza is back for a follow-up. This actually was an early appointment for him. He is feeling well. He tolerated his 8 cycles of treatment in [place] well.  He is not due for his PET scan for another 2 or 3 weeks.  He's had no bony pain. He's had no problems with bowels or bladder. He's had no nausea or vomiting. He's had no rashes. He has had a Labella cough. This poses at nighttime.  He's had no fever. There's been no bleeding.  He is still working. He's tried exercise a little bit more.  Overall, his performance status is ECOG 1.  Medications:  Current outpatient prescriptions:  .  aspirin EC 81 MG tablet, Take 81 mg by mouth., Disp: , Rfl:  .  Cholecalciferol (VITAMIN D-1000 MAX ST) 1000 UNITS tablet, Take 2,000 Units by mouth 3 (three) times daily. , Disp: , Rfl:  .  co-enzyme Q-10 30 MG capsule, Take 200 mg by mouth daily. , Disp: , Rfl:  .  famciclovir (FAMVIR) 250 MG tablet, Take 1 tablet (250 mg total) by mouth daily., Disp: 30 tablet, Rfl: 2 .  finasteride (PROSCAR) 5 MG tablet, Take 5 mg by mouth daily., Disp: , Rfl:  .  flecainide (TAMBOCOR) 50 MG tablet, Take 50 mg by mouth 2 (two) times daily. , Disp: , Rfl:  .  GLUCOSAMINE HCL PO, Take 1 tablet by mouth daily. , Disp: , Rfl:  .  LORazepam (ATIVAN) 0.5 MG tablet, Take 1 tablet (0.5 mg total) by mouth every 6 (six) hours as needed (Nausea or vomiting)., Disp: 30 tablet, Rfl: 0 .  Milk Thistle 140 MG CAPS, Take 250 mg by mouth daily. , Disp: , Rfl:  .  NIFEdipine (PROCARDIA-XL/ADALAT-CC/NIFEDICAL-XL) 30 MG 24 hr tablet, Take 30 mg by mouth., Disp: , Rfl:  .  ondansetron (ZOFRAN) 8 MG tablet, Take 1 tablet (8 mg total) by mouth 2  (two) times daily. Start the day after chemo for 3 days. Then as needed for nausea or vomiting., Disp: 30 tablet, Rfl: 1 .  Plant Sterols and Stanols (CHOLEST OFF PO), Take 300 mg by mouth daily. , Disp: , Rfl:  .  prochlorperazine (COMPAZINE) 10 MG tablet, Take 1 tablet (10 mg total) by mouth every 6 (six) hours as needed (Nausea or vomiting)., Disp: 30 tablet, Rfl: 6 .  Red Yeast Rice Extract (RED YEAST RICE PO), Take 600 mg by mouth 2 (two) times daily. , Disp: , Rfl:  .  saw palmetto 500 MG capsule, Take 450 mg by mouth daily., Disp: , Rfl:  .  testosterone enanthate (DELATESTRYL) 200 MG/ML injection, Inject into the muscle every 14 (fourteen) days. 4 ml every 2 weeks, Disp: , Rfl:   Allergies:  Allergies  Allergen Reactions  . Sulfa Antibiotics Hives  . Sulfa Drugs Cross Reactors Itching and Rash    Past Medical History, Surgical history, Social history, and Family History were reviewed and updated.  Review of Systems: As above  Physical Exam:  height is _0  (1.702 m) and weight is 143 lb (64.864 kg). His oral temperature is 98 F (36.7 C). His blood pressure is 100/62 and his pulse  is 83. His respiration is 16.   Wt Readings from Last 3 Encounters:  07/18/15 143 lb (64.864 kg)  06/27/15 142 lb (64.411 kg)  06/06/15 141 lb (63.957 kg)     Well-developed and well-nourished white gentleman head and neck exam shows no ocular lesions. There is no scleral icterus. He has no oral mucositis. There is no adenopathy in the neck. Lungs are clear bilaterally. Cardiac exam regular rate and rhythm with no murmurs, rubs or bruits. Abdomen is soft. Has good bowel sounds. There is no fluid wave. There is no palpable liver or spleen tip. Back exam shows no tenderness over the spine, ribs or hips. External shows no clubbing, cyanosis or edema. He has good range motion of his joints. There is no tenderness over the hips bilaterally. Skin exam shows no rashes, ecchymoses or petechia. Neurological  exam shows no focal neurological deficits.  Lab Results  Component Value Date   WBC 4.9 07/18/2015   HGB 12.7* 07/18/2015   HCT 37.2* 07/18/2015   MCV 91 07/18/2015   PLT 160 07/18/2015     Chemistry      Component Value Date/Time   NA 136 06/27/2015 0801   NA 135 12/19/2014 2151   K 3.8 06/27/2015 0801   K 4.2 12/19/2014 2151   CL 104 06/27/2015 0801   CL 100* 12/19/2014 2151   CO2 25 06/27/2015 0801   CO2 25 12/19/2014 2151   BUN 15 06/27/2015 0801   BUN 18 12/19/2014 2151   CREATININE 0.9 06/27/2015 0801   CREATININE 0.77 12/19/2014 2151      Component Value Date/Time   CALCIUM 9.0 06/27/2015 0801   CALCIUM 9.3 12/19/2014 2151   ALKPHOS 118* 06/27/2015 0801   ALKPHOS 140* 12/10/2014 1415   AST 31 06/27/2015 0801   AST 14 12/10/2014 1415   ALT 24 06/27/2015 0801   ALT 10 12/10/2014 1415   BILITOT 0.50 06/27/2015 0801   BILITOT 0.3 12/10/2014 1415         Impression and Plan: Logan Mendoza is a 65 year old white male. He has anaplastic large cell non-Hodgkin's lymphoma. 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  We'll set up a PET scan on him in about 2-3 weeks.  He will get the Zometa. Once we finish the chemotherapy, we might go to get him on Xgeva. I think this would be very reasonable.  I spent about 30 minutes with him today.  We will plan to get him back in 3 weeks.  I still think that radiation therapy would not be a bad idea for him. 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 I think this would be reasonable to try to eradicate any micrometastatic disease. I'll have speak with radiation oncology once we get the PET scan results back.    Logan Napoleon, MD 4/6/20179:27 AM

## 2015-07-29 ENCOUNTER — Telehealth: Payer: Self-pay | Admitting: *Deleted

## 2015-07-29 NOTE — Telephone Encounter (Signed)
Patient wants his appointment for the [facility] cancelled. He DOES NOT want to take this drug. He has researched it, and he is unwilling to take the risk of possible side effects due to his mother having complications from [facility].   Injection appointment cancelled. Appointment for lab and MD kept as scheduled.

## 2015-07-31 ENCOUNTER — Ambulatory Visit (HOSPITAL_COMMUNITY)
Admission: RE | Admit: 2015-07-31 | Discharge: 2015-07-31 | Disposition: A | Discharge status: Home / Self Care | Payer: Commercial Managed Care - PPO | Source: Ambulatory Visit | Attending: Hematology & Oncology | Admitting: Hematology & Oncology

## 2015-07-31 DIAGNOSIS — M810 Age-related osteoporosis without current pathological fracture: Secondary | ICD-10-CM | POA: Diagnosis present

## 2015-07-31 DIAGNOSIS — C8462 Anaplastic large cell lymphoma, ALK-positive, intrathoracic lymph nodes: Secondary | ICD-10-CM

## 2015-07-31 DIAGNOSIS — C7951 Secondary malignant neoplasm of bone: Secondary | ICD-10-CM | POA: Diagnosis not present

## 2015-07-31 DIAGNOSIS — I2510 Atherosclerotic heart disease of native coronary artery without angina pectoris: Secondary | ICD-10-CM | POA: Diagnosis not present

## 2015-07-31 DIAGNOSIS — N281 Cyst of kidney, acquired: Secondary | ICD-10-CM | POA: Insufficient documentation

## 2015-07-31 DIAGNOSIS — J8410 Pulmonary fibrosis, unspecified: Secondary | ICD-10-CM | POA: Insufficient documentation

## 2015-07-31 LAB — GLUCOSE, CAPILLARY: Glucose-Capillary: 105 mg/dL — ABNORMAL HIGH (ref 65–99)

## 2015-07-31 MED ORDER — FLUDEOXYGLUCOSE F - 18 (FDG) INJECTION
7.1000 | Freq: Once | INTRAVENOUS | Status: AC | PRN
  Administered 2015-07-31: 7.1 via INTRAVENOUS

## 2015-08-07 NOTE — Progress Notes (Signed)
Histology and Location of Primary Cancer: Anaplastic large cell non-Hodgkin's lymphoma-ALK (+)  Location(s) of Symptomatic Metastases: bony metastasis to his pelvis  Past/Anticipated chemotherapy by medical oncology, if any: Status post cycle 8 of CHOP  Pain on a scale of 0-10 is:  He denies pain in his Pelvis area at this time. He did have pain, but reports Chemotherapy relieved the pain after the 2nd cycle. He reports occasionally when he stands up his Right Leg "needs to wake up". He is not sure-footed at times when he gets up quickly.   IAmbulatory status? Walker? Wheelchair?: He is ambulatory  SAFETY ISSUES:  Prior radiation? No  Pacemaker/ICP: No  Possible current pregnancy? no  Is the patient on methotrexate? No  Current Complaints / other details:   He complains of lower back pain which started several days ago. He also has a bruise to his lower Left arm which he is not sure how it got there.   BP 119/78 mmHg  Pulse 92  Temp(Src) 98.1 F (36.7 C)  Ht _0  (1.702 m)  Wt 147 lb 11.2 oz (66.996 kg)  BMI 23.13 kg/m2  SpO2 98%  Wt Readings from Last 3 Encounters:  08/09/15 147 lb 11.2 oz (66.996 kg)  07/18/15 143 lb (64.864 kg)  06/27/15 142 lb (64.411 kg)

## 2015-08-09 ENCOUNTER — Ambulatory Visit
Admission: RE | Admit: 2015-08-09 | Discharge: 2015-08-09 | Disposition: A | Discharge status: Home / Self Care | Payer: Commercial Managed Care - PPO | Source: Ambulatory Visit | Attending: Radiation Oncology | Admitting: Radiation Oncology

## 2015-08-09 ENCOUNTER — Encounter: Payer: Self-pay | Admitting: Radiation Oncology

## 2015-08-09 VITALS — BP 119/78 | HR 92 | Temp 98.1°F | Ht 67.0 in | Wt 147.7 lb

## 2015-08-09 DIAGNOSIS — I4891 Unspecified atrial fibrillation: Secondary | ICD-10-CM | POA: Diagnosis not present

## 2015-08-09 DIAGNOSIS — K311 Adult hypertrophic pyloric stenosis: Secondary | ICD-10-CM | POA: Insufficient documentation

## 2015-08-09 DIAGNOSIS — C8462 Anaplastic large cell lymphoma, ALK-positive, intrathoracic lymph nodes: Secondary | ICD-10-CM

## 2015-08-09 DIAGNOSIS — I10 Essential (primary) hypertension: Secondary | ICD-10-CM | POA: Insufficient documentation

## 2015-08-09 DIAGNOSIS — Z87442 Personal history of urinary calculi: Secondary | ICD-10-CM | POA: Insufficient documentation

## 2015-08-09 DIAGNOSIS — Z510 Encounter for antineoplastic radiation therapy: Secondary | ICD-10-CM | POA: Insufficient documentation

## 2015-08-09 NOTE — Progress Notes (Signed)
Radiation Oncology         (336) [phone] ________________________________  Initial Outpatient Consultation  Name: Logan Mendoza MRN: [id number]  Date: 08/09/2015  DOB: [birth year]  LN:LGXQ,JJHE C., MD  Volanda Napoleon, MD   REFERRING PHYSICIAN: Volanda Napoleon, MD  DIAGNOSIS: Anaplastic large cell non-Hodgkin's lymphoma  HISTORY OF PRESENT ILLNESS::Logan Mendoza is a 65 y.o. male who presents with a medical history of anaplastic large cell non-Hodgkin's lymphoma-ALK. The patient began to have some increasing pain over the right hip area in mid-2016. This came to the point that whenever he try to exercise the next day he could barely walk.  He ultimately was seen by orthopedic surgery. He had some plain x-rays done and abnormalities were noted in the pelvis. He then underwent a biopsy of a right ischial lesion. This is done on 11/19/2014. The pathology report ([id number]) showed anaplastic large cell lymphoma. It was ALK positive. The patient was referred to Dr. Marin Olp and he has followed the patient since.  PET scan prior to therapy showed essentially sacrum and lower pelvis involvement without any soft tissue involvement.  The patient is status post cycle 8 of CHOP. The patient was scheduled to start Xgeva 120gm sq q 3 months in 08/2015, but he has declined due to possible side effects.  Dr. Marin Olp has referred the patient to me today to discuss the role of radiation in the management of his disease.  PET scan on 07/31/15 shows stable to decreased hypermetabolism within pelvic osseous metastases. There is no new or progressive hypermetabolic metastatic disease.  PREVIOUS RADIATION THERAPY: No  PAST MEDICAL HISTORY:  has a past medical history of Hypertension; Atrial fibrillation ([place]); Pyloric stenosis; Kidney stones; Anaplastic ALK-positive large cell lymphoma ([place]); and Anaplastic large cell lymphoma, ALK-positive, intrathoracic lymph nodes ([place]) (12/26/2014).    PAST SURGICAL  HISTORY: Past Surgical History  Procedure Laterality Date  . Cystoscopy    . Pyloric stenosis repair      age 8 yrs  . Hernia repair      FAMILY HISTORY: family history is not on file.  SOCIAL HISTORY:  reports that he has never smoked. He does not have any smokeless tobacco history on file. He reports that he drinks about 0.6 oz of alcohol per week. He reports that he does not use illicit drugs.  ALLERGIES: Sulfa antibiotics and Sulfa drugs cross reactors  MEDICATIONS:  Current Outpatient Prescriptions  Medication Sig Dispense Refill  . aspirin EC 81 MG tablet Take 81 mg by mouth.    . Cholecalciferol (VITAMIN D-1000 MAX ST) 1000 UNITS tablet Take 2,000 Units by mouth 3 (three) times daily.     Marland Kitchen co-enzyme Q-10 30 MG capsule Take 200 mg by mouth daily.     . finasteride (PROSCAR) 5 MG tablet Take 5 mg by mouth daily.    . flecainide (TAMBOCOR) 50 MG tablet Take 50 mg by mouth 2 (two) times daily.     Marland Kitchen GLUCOSAMINE HCL PO Take 1 tablet by mouth daily.     . Milk Thistle 140 MG CAPS Take 250 mg by mouth daily.     Marland Kitchen NIFEdipine (PROCARDIA-XL/ADALAT-CC/NIFEDICAL-XL) 30 MG 24 hr tablet Take 30 mg by mouth.    . Plant Sterols and Stanols (CHOLEST OFF PO) Take 300 mg by mouth daily.     . Red Yeast Rice Extract (RED YEAST RICE PO) Take 600 mg by mouth 2 (two) times daily.     . saw palmetto 500 MG capsule  Take 450 mg by mouth daily.    Marland Kitchen testosterone enanthate (DELATESTRYL) 200 MG/ML injection Inject into the muscle every 14 (fourteen) days. 4 ml every 2 weeks    . LORazepam (ATIVAN) 0.5 MG tablet Take 1 tablet (0.5 mg total) by mouth every 6 (six) hours as needed (Nausea or vomiting). (Patient not taking: Reported on 08/09/2015) 30 tablet 0  . ondansetron (ZOFRAN) 8 MG tablet Take 1 tablet (8 mg total) by mouth 2 (two) times daily. Start the day after chemo for 3 days. Then as needed for nausea or vomiting. (Patient not taking: Reported on 08/09/2015) 30 tablet 1  . prochlorperazine  (COMPAZINE) 10 MG tablet Take 1 tablet (10 mg total) by mouth every 6 (six) hours as needed (Nausea or vomiting). (Patient not taking: Reported on 08/09/2015) 30 tablet 6   No current facility-administered medications for this encounter.    REVIEW OF SYSTEMS:  A 15 point review of systems is documented in the electronic medical record. This was obtained by the nursing staff. However, I reviewed this with the patient to discuss relevant findings and make appropriate changes.  The patient reports tingling and numbness in his feet and fingers. His fingers are 80% better right now. His energy levels are at 90%. His pain is almost nonexistent at this time, but would occasionally flair up. Denies headaches. He wears contacts/glasses. Reports he has cataracts and they have grown somewhat during treatment, according to his opthalmologist.   PHYSICAL EXAM:  height is 5' 7"  (1.702 m) and weight is 147 lb 11.2 oz (66.996 kg). His temperature is 98.1 F (36.7 C). His blood pressure is 119/78 and his pulse is 92. His oxygen saturation is 98%.   General: Alert and oriented, in no acute distress HEENT: Head is normocephalic. Extraocular movements are intact. Oropharynx is clear. Neck: Neck is supple, no palpable cervical or supraclavicular lymphadenopathy. Heart: Regular in rate and rhythm with no murmurs, rubs, or gallops. Chest: Clear to auscultation bilaterally, with no rhonchi, wheezes, or rales. Abdomen: Soft, nontender, nondistended, with no rigidity or guarding. Extremities: No cyanosis or edema. Lymphatics: see Neck Exam Skin: No concerning lesions. The patient has a 1 inch laceration on the anterior left shin (he reports it is from falling through a chair). Superficial and looks like it's healing well. Musculoskeletal: symmetric strength and muscle tone throughout. Neurologic: Cranial nerves II through XII are grossly intact. No obvious focalities. Speech is fluent. Coordination is  intact. Psychiatric: Judgment and insight are intact. Affect is appropriate.  ECOG = 1  LABORATORY DATA:  Lab Results  Component Value Date   WBC 4.9 07/18/2015   HGB 12.7* 07/18/2015   HCT 37.2* 07/18/2015   MCV 91 07/18/2015   PLT 160 07/18/2015   NEUTROABS 3.7 07/18/2015   Lab Results  Component Value Date   NA 138 07/18/2015   K 4.1 07/18/2015   CL 104 06/27/2015   CO2 23 07/18/2015   GLUCOSE 99 07/18/2015   CREATININE 0.8 07/18/2015   CALCIUM 8.9 07/18/2015      RADIOGRAPHY: Nm Pet Image Restag (ps) Skull Base To Thigh  07/31/2015  CLINICAL DATA:  Subsequent treatment strategy for non-Hodgkin's (anaplastic large cell) lymphoma status post chemotherapy. EXAM: NUCLEAR MEDICINE PET SKULL BASE TO THIGH TECHNIQUE: 7.1 mCi F-18 FDG was injected intravenously. Full-ring PET imaging was performed from the skull base to thigh after the radiotracer. CT data was obtained and used for attenuation correction and anatomic localization. FASTING BLOOD GLUCOSE:  Value: 105 mg/dl  COMPARISON:  04/19/2015 PET-CT. FINDINGS: NECK No hypermetabolic lymph nodes in the neck. CHEST No hypermetabolic axillary, mediastinal or hilar nodes. Subcentimeter stable left upper lobe calcified granulomas. No acute consolidative airspace disease or significant pulmonary nodules. Coronary atherosclerosis. ABDOMEN/PELVIS No abnormal hypermetabolic activity within the liver, pancreas, adrenal glands, or spleen. No hypermetabolic lymph nodes in the abdomen or pelvis. Simple right renal cysts, largest 4.0 cm in the anterior interpolar right kidney. Stable mild prostatomegaly. SKELETON Mildly expansile mixed lytic and sclerotic right ischial osseous metastasis demonstrates mild hypermetabolism with max SUV 3.6, previous max SUV 3.4, not appreciably changed. Mildly expansile mixed lytic and sclerotic left iliac wing osseous metastasis demonstrates minimal hypermetabolism with max SUV 2.8, previous max SUV 4.3, decreased. Mildly  expansile mixed lytic and sclerotic midline upper sacral osseous metastasis demonstrates minimal hypermetabolism with max SUV 3.0, previous max SUV 2.7, not appreciably changed. No new hypermetabolic osseous lesions. IMPRESSION: 1. Stable to decreased hypermetabolism within pelvic osseous metastases. No new or progressive hypermetabolic metastatic disease. 2. Coronary atherosclerosis. Electronically Signed   By: Ilona Sorrel M.D.   On: 07/31/2015 09:50      IMPRESSION: Anaplastic large cell non-Hodgkin's lymphoma-ALK  Presenting in multiple areas of osseous metastasis in the lower pelvis region.  The patient had an excellent response to chemotherapy with possible residual disease on recent PET scan imaging. The patient is asymptomatic at this time. There is no residual hip pain or other issues. His energy continues to improve.  Options to consider is to follow the patient closely and if his pain recurs, then radiation would be possible. Another option is to treat all initial sites of presentation in the pelvic bony area, but I would be hesitant to do this in light of possible side effects and significant bone marrow involvement. The third option of treatment is to consider irradiating the initial site of pain presentation and most extensive involvement on PET and CT imaging  Located in the right lower pelvis region.  PLAN: I will discuss these options with Dr. Marin Olp before proceeding with any course of treatment.     ------------------------------------------------  Blair Promise, PhD, MD  This document serves as a record of services personally performed by Gery Pray, MD. It was created on his behalf by Darcus Austin, a trained medical scribe. The creation of this record is based on the scribe's personal observations and the provider's statements to them. This document has been checked and approved by the attending provider.

## 2015-08-22 ENCOUNTER — Ambulatory Visit (HOSPITAL_BASED_OUTPATIENT_CLINIC_OR_DEPARTMENT_OTHER): Payer: Commercial Managed Care - PPO | Admitting: Hematology & Oncology

## 2015-08-22 ENCOUNTER — Encounter: Payer: Self-pay | Admitting: Hematology & Oncology

## 2015-08-22 ENCOUNTER — Other Ambulatory Visit (HOSPITAL_BASED_OUTPATIENT_CLINIC_OR_DEPARTMENT_OTHER): Payer: Commercial Managed Care - PPO

## 2015-08-22 ENCOUNTER — Ambulatory Visit: Payer: Commercial Managed Care - PPO

## 2015-08-22 VITALS — BP 116/69 | HR 76 | Temp 98.1°F | Resp 16 | Ht 67.0 in | Wt 146.0 lb

## 2015-08-22 DIAGNOSIS — C8330 Diffuse large B-cell lymphoma, unspecified site: Secondary | ICD-10-CM | POA: Diagnosis not present

## 2015-08-22 DIAGNOSIS — C8462 Anaplastic large cell lymphoma, ALK-positive, intrathoracic lymph nodes: Secondary | ICD-10-CM

## 2015-08-22 DIAGNOSIS — E559 Vitamin D deficiency, unspecified: Secondary | ICD-10-CM

## 2015-08-22 DIAGNOSIS — M810 Age-related osteoporosis without current pathological fracture: Secondary | ICD-10-CM

## 2015-08-22 LAB — CMP (CANCER CENTER ONLY)
ALT(SGPT): 32 U/L (ref 10–47)
AST: 32 U/L (ref 11–38)
Albumin: 3.5 g/dL (ref 3.3–5.5)
Alkaline Phosphatase: 85 U/L — ABNORMAL HIGH (ref 26–84)
BUN: 15 mg/dL (ref 7–22)
CHLORIDE: 102 meq/L (ref 98–108)
CO2: 25 meq/L (ref 18–33)
CREATININE: 0.9 mg/dL (ref 0.6–1.2)
Calcium: 9.8 mg/dL (ref 8.0–10.3)
Glucose, Bld: 100 mg/dL (ref 73–118)
POTASSIUM: 4.4 meq/L (ref 3.3–4.7)
SODIUM: 137 meq/L (ref 128–145)
Total Bilirubin: 0.7 mg/dl (ref 0.20–1.60)
Total Protein: 6.4 g/dL (ref 6.4–8.1)

## 2015-08-22 LAB — CBC WITH DIFFERENTIAL (CANCER CENTER ONLY)
BASO#: 0 10*3/uL (ref 0.0–0.2)
BASO%: 0.2 % (ref 0.0–2.0)
EOS ABS: 0.1 10*3/uL (ref 0.0–0.5)
EOS%: 1.7 % (ref 0.0–7.0)
HCT: 38.8 % (ref 38.7–49.9)
HGB: 13.4 g/dL (ref 13.0–17.1)
LYMPH#: 1.1 10*3/uL (ref 0.9–3.3)
LYMPH%: 22.8 % (ref 14.0–48.0)
MCH: 31.6 pg (ref 28.0–33.4)
MCHC: 34.5 g/dL (ref 32.0–35.9)
MCV: 92 fL (ref 82–98)
MONO#: 0.4 10*3/uL (ref 0.1–0.9)
MONO%: 9.2 % (ref 0.0–13.0)
NEUT#: 3.1 10*3/uL (ref 1.5–6.5)
NEUT%: 66.1 % (ref 40.0–80.0)
PLATELETS: 162 10*3/uL (ref 145–400)
RBC: 4.24 10*6/uL (ref 4.20–5.70)
RDW: 14.5 % (ref 11.1–15.7)
WBC: 4.7 10*3/uL (ref 4.0–10.0)

## 2015-08-22 LAB — LACTATE DEHYDROGENASE: LDH: 166 U/L (ref 125–245)

## 2015-08-22 NOTE — Progress Notes (Signed)
Hematology and Oncology Follow Up Visit  SHIRLEY BOLLE [id number] 10-May-1950 65 y.o. 08/22/2015   Principle Diagnosis:   Anaplastic large cell non-Hodgkin's lymphoma-ALK (+)  Current Therapy:    Status post cycle 8 of CHOP       Interim History:  Mr. Cumba is back for a follow-up. He is doing well. He is exercising. His hair is coming back nicely.  We did go ahead and get a PET scan on him. This is done on April 19. This showed that he had a very good response. There was some minimal hyper metabolism in the pelvic region. This is where his initial disease was noted.  He saw that a [place] of radiation oncology. That he Conniff felt that radiation to the site of initial bulky disease would probably be reasonable. He did not want to radiate the entire pelvis because of potential complications. I understand this and I think this would be reasonable.  He does not want to have any Xgeva. He thinks that he is going to have a fracture as per his mom had a fracture from taking Boniva. I told Mr. not find that Delton See is not in the same category as Boniva. He still does not wish to take it. As such, we will hold off on the Xgeva.   He is having a problem with fever. He's having no problems with bowels or bladder. He's having no problems with leg swelling. He has occasional discomfort in the pelvic area. He has occasional discomfort in the left mandible.  Overall, his performance status is ECOG 1.  Medications:  Current outpatient prescriptions:  .  aspirin EC 81 MG tablet, Take 81 mg by mouth., Disp: , Rfl:  .  Cholecalciferol (VITAMIN D-1000 MAX ST) 1000 UNITS tablet, Take 2,000 Units by mouth 3 (three) times daily. , Disp: , Rfl:  .  co-enzyme Q-10 30 MG capsule, Take 200 mg by mouth daily. , Disp: , Rfl:  .  flecainide (TAMBOCOR) 50 MG tablet, Take 50 mg by mouth 2 (two) times daily. , Disp: , Rfl:  .  GLUCOSAMINE HCL PO, Take 1 tablet by mouth daily. , Disp: , Rfl:  .  Milk Thistle 140  MG CAPS, Take 250 mg by mouth daily. , Disp: , Rfl:  .  NIFEdipine (PROCARDIA-XL/ADALAT-CC/NIFEDICAL-XL) 30 MG 24 hr tablet, Take 30 mg by mouth., Disp: , Rfl:  .  Plant Sterols and Stanols (CHOLEST OFF PO), Take 300 mg by mouth daily. , Disp: , Rfl:  .  Red Yeast Rice Extract (RED YEAST RICE PO), Take 600 mg by mouth 2 (two) times daily. , Disp: , Rfl:  .  saw palmetto 500 MG capsule, Take 450 mg by mouth daily., Disp: , Rfl:  .  testosterone enanthate (DELATESTRYL) 200 MG/ML injection, Inject into the muscle every 14 (fourteen) days. 4 ml every 2 weeks, Disp: , Rfl:  .  finasteride (PROSCAR) 5 MG tablet, Take 5 mg by mouth daily., Disp: , Rfl:   Allergies:  Allergies  Allergen Reactions  . Sulfa Antibiotics Hives  . Sulfa Drugs Cross Reactors Itching and Rash    Past Medical History, Surgical history, Social history, and Family History were reviewed and updated.  Review of Systems: As above  Physical Exam:  height is 5' 7" (1.702 m) and weight is 146 lb (66.225 kg). His oral temperature is 98.1 F (36.7 C). His blood pressure is 116/69 and his pulse is 76. His respiration is 16.   Wt Readings  from Last 3 Encounters:  08/22/15 146 lb (66.225 kg)  08/09/15 147 lb 11.2 oz (66.996 kg)  07/18/15 143 lb (64.864 kg)     Well-developed and well-nourished white gentleman head and neck exam shows no ocular lesions. There is no scleral icterus. He has no oral mucositis. There is no adenopathy in the neck. Lungs are clear bilaterally. Cardiac exam regular rate and rhythm with no murmurs, rubs or bruits. Abdomen is soft. Has good bowel sounds. There is no fluid wave. There is no palpable liver or spleen tip. Back exam shows no tenderness over the spine, ribs or hips. External shows no clubbing, cyanosis or edema. He has good range motion of his joints. There is no tenderness over the hips bilaterally. Skin exam shows no rashes, ecchymoses or petechia. Neurological exam shows no focal neurological  deficits.  Lab Results  Component Value Date   WBC 4.7 08/22/2015   HGB 13.4 08/22/2015   HCT 38.8 08/22/2015   MCV 92 08/22/2015   PLT 162 08/22/2015     Chemistry      Component Value Date/Time   NA 137 08/22/2015 1055   NA 138 07/18/2015 0816   NA 135 12/19/2014 2151   K 4.4 08/22/2015 1055   K 4.1 07/18/2015 0816   K 4.2 12/19/2014 2151   CL 102 08/22/2015 1055   CL 100* 12/19/2014 2151   CO2 25 08/22/2015 1055   CO2 23 07/18/2015 0816   CO2 25 12/19/2014 2151   BUN 15 08/22/2015 1055   BUN 15.2 07/18/2015 0816   BUN 18 12/19/2014 2151   CREATININE 0.9 08/22/2015 1055   CREATININE 0.8 07/18/2015 0816   CREATININE 0.77 12/19/2014 2151      Component Value Date/Time   CALCIUM 9.8 08/22/2015 1055   CALCIUM 8.9 07/18/2015 0816   CALCIUM 9.3 12/19/2014 2151   ALKPHOS 85* 08/22/2015 1055   ALKPHOS 96 07/18/2015 0816   ALKPHOS 140* 12/10/2014 1415   AST 32 08/22/2015 1055   AST 25 07/18/2015 0816   AST 14 12/10/2014 1415   ALT 32 08/22/2015 1055   ALT 22 07/18/2015 0816   ALT 10 12/10/2014 1415   BILITOT 0.70 08/22/2015 1055   BILITOT 0.44 07/18/2015 0816   BILITOT 0.3 12/10/2014 1415         Impression and Plan: Mr. Garske is a 65 year old white male. He has anaplastic large cell non-Hodgkin's lymphoma. 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  The PET scan looks quite encouraging to me.  I still think that radiation would be a very good idea for him. He has seen Dr. Sondra Come. I think where he had the most bulk of disease initially would be the area for radiation to have the most effect.  I talked to him about this for 20 Ms. He does not want Xgeva. He is so worried about having a fracture. I try to tell him that Delton See is not a bisphosphonate and they have we do not have the couple occasions that we see with bisphosphonates.  I will plan to get him back in 2 months. We probably would not repeat a PET scan until she went after he finishes radiation.     Volanda Napoleon, MD 5/11/201712:32 PM

## 2015-08-23 LAB — VITAMIN D 25 HYDROXY (VIT D DEFICIENCY, FRACTURES): Vitamin D, 25-Hydroxy: 47.7 ng/mL (ref 30.0–100.0)

## 2015-08-27 ENCOUNTER — Ambulatory Visit
Admission: RE | Admit: 2015-08-27 | Discharge: 2015-08-27 | Disposition: A | Discharge status: Home / Self Care | Payer: Commercial Managed Care - PPO | Source: Ambulatory Visit | Attending: Radiation Oncology | Admitting: Radiation Oncology

## 2015-08-27 DIAGNOSIS — C8462 Anaplastic large cell lymphoma, ALK-positive, intrathoracic lymph nodes: Secondary | ICD-10-CM

## 2015-08-27 DIAGNOSIS — Z510 Encounter for antineoplastic radiation therapy: Secondary | ICD-10-CM | POA: Diagnosis not present

## 2015-08-27 NOTE — Progress Notes (Signed)
  Radiation Oncology         (336) [phone] ________________________________  Name: Logan Mendoza MRN: [id number]  Date: 08/27/2015  DOB: [birth year]  SIMULATION AND TREATMENT PLANNING NOTE    ICD-9-CM ICD-10-CM   1. Anaplastic large cell lymphoma, ALK-positive, intrathoracic lymph nodes (HCC) 200.62 C84.62     DIAGNOSIS:  Anaplastic large cell non-Hodgkin's lymphoma-ALK Presenting in multiple areas of osseous metastasis in the lower pelvis region.  NARRATIVE:  The patient was brought to the [facility].  Identity was confirmed.  All relevant records and images related to the planned course of therapy were reviewed.  The patient freely provided informed written consent to proceed with treatment after reviewing the details related to the planned course of therapy. The consent form was witnessed and verified by the simulation staff.  Then, the patient was set-up in a stable reproducible  supine position for radiation therapy.  CT images were obtained.  Surface markings were placed.  The CT images were loaded into the planning software.  Then the target and avoidance structures were contoured.  Treatment planning then occurred.  The radiation prescription was entered and confirmed.  Then, I designed and supervised the construction of a total of 1 medically necessary complex treatment devices.  I have requested : 3D Simulation  I have requested a DVH of the following structures: bladder, rectum, GTV, PTV.  I have ordered:dose calc.  PLAN:  The patient will receive 36 Gy in 18 fraction.  ________________________________  -----------------------------------  Blair Promise, PhD, MD.

## 2015-09-03 ENCOUNTER — Ambulatory Visit
Admission: RE | Admit: 2015-09-03 | Discharge: 2015-09-03 | Disposition: A | Discharge status: Home / Self Care | Payer: Commercial Managed Care - PPO | Source: Ambulatory Visit | Attending: Radiation Oncology | Admitting: Radiation Oncology

## 2015-09-03 ENCOUNTER — Encounter: Payer: Self-pay | Admitting: Radiation Oncology

## 2015-09-03 VITALS — BP 147/88 | HR 103 | Temp 98.0°F | Ht 67.0 in | Wt 151.1 lb

## 2015-09-03 DIAGNOSIS — C8462 Anaplastic large cell lymphoma, ALK-positive, intrathoracic lymph nodes: Secondary | ICD-10-CM

## 2015-09-03 DIAGNOSIS — Z510 Encounter for antineoplastic radiation therapy: Secondary | ICD-10-CM | POA: Diagnosis not present

## 2015-09-03 NOTE — Progress Notes (Signed)
  Radiation Oncology         [phone]) [phone] ________________________________  Name: Logan Mendoza MRN: [id number]  Date: 09/03/2015  DOB: [birth year]  Weekly Radiation Therapy Management    ICD-9-CM ICD-10-CM   1. Anaplastic large cell lymphoma, ALK-positive, intrathoracic lymph nodes (HCC) 200.62 C84.62    DIAGNOSIS: Anaplastic large cell non-Hodgkin's lymphoma-ALK Presenting in multiple areas of osseous metastasis in the lower pelvis region.  Current Dose: 2 Gy     Planned Dose:  36 Gy  Narrative . . . . . . . . The patient presents for routine under treatment assessment.                                   The patient is without complaint.                                 Set-up films were reviewed.                                 The chart was checked. Physical Findings. . .  height is '5\' 7"'$  (1.702 m) and weight is 151 lb 1.6 oz (68.539 kg). His oral temperature is 98 F (36.7 C). His blood pressure is 147/88 and his pulse is 103. . Weight essentially stable.  No significant changes. Lungs are clear to auscultation bilaterally. Heart has regular rate and rhythm. No palpable cervical, supraclavicular, or axillary adenopathy. Abdomen soft, non-tender, normal bowel sounds. Impression . . . . . . . The patient is tolerating radiation. Plan . . . . . . . . . . . . Continue treatment as planned.  ________________________________   Blair Promise, PhD, MD

## 2015-09-03 NOTE — Progress Notes (Signed)
Logan Mendoza has completed 1 fractions to his right pelvis.  He denies having pain and does not have any other complaints.  He is wondering why his treatment plan has changed with added dates.  BP 147/88 mmHg  Pulse 103  Temp(Src) 98 F (36.7 C) (Oral)  Ht 5\' 7"  (1.702 m)  Wt 151 lb 1.6 oz (68.539 kg)  BMI 23.66 kg/m2

## 2015-09-03 NOTE — Progress Notes (Signed)
  Radiation Oncology         (336) [phone] ________________________________  Name: Logan Mendoza MRN: [id number]  Date: 09/03/2015  DOB: Feb 10, 1951  Simulation Verification Note    ICD-9-CM ICD-10-CM   1. Anaplastic large cell lymphoma, ALK-positive, intrathoracic lymph nodes (HCC) 200.62 C84.62     Status: outpatient  NARRATIVE: The patient was brought to the treatment unit and placed in the planned treatment position. The clinical setup was verified. Then port films were obtained and uploaded to the radiation oncology medical record software.  The treatment beams were carefully compared against the planned radiation fields. The position location and shape of the radiation fields was reviewed. They targeted volume of tissue appears to be appropriately covered by the radiation beams. Organs at risk appear to be excluded as planned.  Based on my personal review, I approved the simulation verification. The patient's treatment will proceed as planned.  -----------------------------------  Blair Promise, PhD, MD

## 2015-09-03 NOTE — Progress Notes (Signed)
Pt here for patient teaching.  Pt given Radiation and You booklet. Pt reports they have not watched the Radiation Therapy Education video and has been given the link to watch at home.  Reviewed areas of pertinence such as diarrhea, fatigue, nausea and vomiting, skin changes and urinary and bladder changes . Pt able to give teach back of to pat skin and use unscented/gentle soap,avoid applying anything to skin within 4 hours of treatment. Pt demonstrated understanding and verbalizes understanding of information given and will contact nursing with any questions or concerns.

## 2015-09-04 ENCOUNTER — Ambulatory Visit
Admission: RE | Admit: 2015-09-04 | Discharge: 2015-09-04 | Disposition: A | Discharge status: Home / Self Care | Payer: Commercial Managed Care - PPO | Source: Ambulatory Visit | Attending: Radiation Oncology | Admitting: Radiation Oncology

## 2015-09-04 DIAGNOSIS — Z510 Encounter for antineoplastic radiation therapy: Secondary | ICD-10-CM | POA: Diagnosis not present

## 2015-09-05 ENCOUNTER — Ambulatory Visit
Admission: RE | Admit: 2015-09-05 | Discharge: 2015-09-05 | Disposition: A | Discharge status: Home / Self Care | Payer: Commercial Managed Care - PPO | Source: Ambulatory Visit | Attending: Radiation Oncology | Admitting: Radiation Oncology

## 2015-09-05 DIAGNOSIS — Z510 Encounter for antineoplastic radiation therapy: Secondary | ICD-10-CM | POA: Diagnosis not present

## 2015-09-06 ENCOUNTER — Ambulatory Visit
Admission: RE | Admit: 2015-09-06 | Discharge: 2015-09-06 | Disposition: A | Discharge status: Home / Self Care | Payer: Commercial Managed Care - PPO | Source: Ambulatory Visit | Attending: Radiation Oncology | Admitting: Radiation Oncology

## 2015-09-06 DIAGNOSIS — Z510 Encounter for antineoplastic radiation therapy: Secondary | ICD-10-CM | POA: Diagnosis not present

## 2015-09-10 ENCOUNTER — Ambulatory Visit
Admission: RE | Admit: 2015-09-10 | Discharge: 2015-09-10 | Disposition: A | Discharge status: Home / Self Care | Payer: Commercial Managed Care - PPO | Source: Ambulatory Visit | Attending: Radiation Oncology | Admitting: Radiation Oncology

## 2015-09-10 DIAGNOSIS — Z510 Encounter for antineoplastic radiation therapy: Secondary | ICD-10-CM | POA: Diagnosis not present

## 2015-09-11 ENCOUNTER — Ambulatory Visit
Admission: RE | Admit: 2015-09-11 | Discharge: 2015-09-11 | Disposition: A | Discharge status: Home / Self Care | Payer: Commercial Managed Care - PPO | Source: Ambulatory Visit | Attending: Radiation Oncology | Admitting: Radiation Oncology

## 2015-09-11 DIAGNOSIS — Z510 Encounter for antineoplastic radiation therapy: Secondary | ICD-10-CM | POA: Diagnosis not present

## 2015-09-11 DIAGNOSIS — C8462 Anaplastic large cell lymphoma, ALK-positive, intrathoracic lymph nodes: Secondary | ICD-10-CM

## 2015-09-11 NOTE — Progress Notes (Signed)
  Radiation Oncology         [phone]) [phone] ________________________________  Name: Logan Mendoza MRN: [id number]  Date: 09/11/2015  DOB: 29-Jan-1951  Weekly Radiation Therapy Management    ICD-9-CM ICD-10-CM   1. Anaplastic large cell lymphoma, ALK-positive, intrathoracic lymph nodes (HCC) 200.62 C84.62    DIAGNOSIS: Anaplastic large cell non-Hodgkin's lymphoma-ALK Presenting in multiple areas of osseous metastasis in the lower pelvis region.  Current Dose: 12 Gy     Planned Dose:  36 Gy  Narrative . . . . . . . . The patient presents for routine under treatment assessment.                      Logan Mendoza has received 6 fractions to his pelvis. He denies any pain, but notes stools are softer and more frequent. Had 1 episode of rectal itching which is not present at this time. He hasn't noticed much fatigue, but he ends up going to bed sooner.                                Set-up films were reviewed.                                 The chart was checked. Physical Findings. . .  vitals were not taken for this visit.. Weight essentially stable.  No significant changes. Lungs are clear to auscultation bilaterally. Heart has regular rate and rhythm. Abdomen soft nontender with normal bowel sounds. Impression . . . . . . . The patient is tolerating radiation. Plan . . . . . . . . . . . . Continue treatment as planned.  ________________________________   Blair Promise, PhD, MD  This document serves as a record of services personally performed by Gery Pray, MD. It was created on his behalf by Darcus Austin, a trained medical scribe. The creation of this record is based on the scribe's personal observations and the provider's statements to them. This document has been checked and approved by the attending provider.

## 2015-09-11 NOTE — Progress Notes (Addendum)
Logan Mendoza has received 6 fractions to his pelvis.  He denies any pain, but notes stools are softer and more frequent.  Had 1 episode of rectal itching which is not present at this time.

## 2015-09-12 ENCOUNTER — Ambulatory Visit
Admission: RE | Admit: 2015-09-12 | Discharge: 2015-09-12 | Disposition: A | Discharge status: Home / Self Care | Payer: Commercial Managed Care - PPO | Source: Ambulatory Visit | Attending: Radiation Oncology | Admitting: Radiation Oncology

## 2015-09-12 DIAGNOSIS — Z510 Encounter for antineoplastic radiation therapy: Secondary | ICD-10-CM | POA: Diagnosis not present

## 2015-09-13 ENCOUNTER — Ambulatory Visit
Admission: RE | Admit: 2015-09-13 | Discharge: 2015-09-13 | Disposition: A | Discharge status: Home / Self Care | Payer: Commercial Managed Care - PPO | Source: Ambulatory Visit | Attending: Radiation Oncology | Admitting: Radiation Oncology

## 2015-09-13 DIAGNOSIS — Z510 Encounter for antineoplastic radiation therapy: Secondary | ICD-10-CM | POA: Diagnosis not present

## 2015-09-16 ENCOUNTER — Ambulatory Visit
Admission: RE | Admit: 2015-09-16 | Discharge: 2015-09-16 | Disposition: A | Discharge status: Home / Self Care | Payer: Commercial Managed Care - PPO | Source: Ambulatory Visit | Attending: Radiation Oncology | Admitting: Radiation Oncology

## 2015-09-16 DIAGNOSIS — Z510 Encounter for antineoplastic radiation therapy: Secondary | ICD-10-CM | POA: Diagnosis not present

## 2015-09-17 ENCOUNTER — Ambulatory Visit
Admission: RE | Admit: 2015-09-17 | Discharge: 2015-09-17 | Disposition: A | Discharge status: Home / Self Care | Payer: Commercial Managed Care - PPO | Source: Ambulatory Visit | Attending: Radiation Oncology | Admitting: Radiation Oncology

## 2015-09-17 ENCOUNTER — Encounter: Payer: Self-pay | Admitting: Radiation Oncology

## 2015-09-17 VITALS — BP 115/75 | HR 80 | Temp 98.2°F | Ht 67.0 in | Wt 153.0 lb

## 2015-09-17 DIAGNOSIS — C8462 Anaplastic large cell lymphoma, ALK-positive, intrathoracic lymph nodes: Secondary | ICD-10-CM

## 2015-09-17 DIAGNOSIS — Z510 Encounter for antineoplastic radiation therapy: Secondary | ICD-10-CM | POA: Diagnosis not present

## 2015-09-17 NOTE — Progress Notes (Signed)
  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  Current Dose: 20 Gy     Planned Dose:  36 Gy  Narrative . . . . . . . . The patient presents for routine under treatment assessment.                                Logan Mendoza has completed 10 fractions to his right pelvis. He denies pain today but has occasional "bone pain" in his upper back, left forearm and lower legs. He reports having tenderness in his right groin on Sunday and Monday. He denies having any bladder/bowel issues. He denies having fatigue.                                Set-up films were reviewed.                                 The chart was checked. Physical Findings. . .  height is '5\' 7"'$  (1.702 m) and weight is 153 lb (69.4 kg). His oral temperature is 98.2 F (36.8 C). His blood pressure is 115/75 and his pulse is 80. His oxygen saturation is 99%. . Weight essentially stable.  No significant changes. Lungs are clear to auscultation bilaterally. Heart has regular rate and rhythm. Abdomen soft nontender with normal bowel sounds. Impression . . . . . . . The patient is tolerating radiation. Plan . . . . . . . . . . . . Continue treatment as planned.  ________________________________   Blair Promise, PhD, MD    This document serves as a record of services personally performed by Gery Pray, MD. It was created on his behalf by Lendon Collar, a trained medical scribe. The creation of this record is based on the scribe's personal observations and the provider's statements to them. This document has been checked and approved by the attending provider. jj

## 2015-09-17 NOTE — Progress Notes (Signed)
Logan Mendoza has completed 10 fractions to his right pelvis.  He denies pain today but has occasional "bone pain" in his upper back, left forearm and lower legs.  He reports having tenderness in his right groin on Sunday and Monday.  He denies having any bladder/bowel issues.  He denies having fatigue.  BP 115/75 mmHg  Pulse 80  Temp(Src) 98.2 F (36.8 C) (Oral)  Ht 5\' 7"  (1.702 m)  Wt 153 lb (69.4 kg)  BMI 23.96 kg/m2  SpO2 99%   Wt Readings from Last 3 Encounters:  09/17/15 153 lb (69.4 kg)  09/03/15 151 lb 1.6 oz (68.539 kg)  08/22/15 146 lb (66.225 kg)

## 2015-09-18 ENCOUNTER — Ambulatory Visit
Admission: RE | Admit: 2015-09-18 | Discharge: 2015-09-18 | Disposition: A | Discharge status: Home / Self Care | Payer: Commercial Managed Care - PPO | Source: Ambulatory Visit | Attending: Radiation Oncology | Admitting: Radiation Oncology

## 2015-09-18 DIAGNOSIS — Z510 Encounter for antineoplastic radiation therapy: Secondary | ICD-10-CM | POA: Diagnosis not present

## 2015-09-19 ENCOUNTER — Ambulatory Visit
Admission: RE | Admit: 2015-09-19 | Discharge: 2015-09-19 | Disposition: A | Discharge status: Home / Self Care | Payer: Commercial Managed Care - PPO | Source: Ambulatory Visit | Attending: Radiation Oncology | Admitting: Radiation Oncology

## 2015-09-19 DIAGNOSIS — Z510 Encounter for antineoplastic radiation therapy: Secondary | ICD-10-CM | POA: Diagnosis not present

## 2015-09-20 ENCOUNTER — Ambulatory Visit
Admission: RE | Admit: 2015-09-20 | Discharge: 2015-09-20 | Disposition: A | Discharge status: Home / Self Care | Payer: Commercial Managed Care - PPO | Source: Ambulatory Visit | Attending: Radiation Oncology | Admitting: Radiation Oncology

## 2015-09-20 DIAGNOSIS — Z510 Encounter for antineoplastic radiation therapy: Secondary | ICD-10-CM | POA: Diagnosis not present

## 2015-09-23 ENCOUNTER — Ambulatory Visit: Payer: Commercial Managed Care - PPO

## 2015-09-23 ENCOUNTER — Ambulatory Visit
Admission: RE | Admit: 2015-09-23 | Discharge: 2015-09-23 | Disposition: A | Discharge status: Home / Self Care | Payer: Commercial Managed Care - PPO | Source: Ambulatory Visit | Attending: Radiation Oncology | Admitting: Radiation Oncology

## 2015-09-23 DIAGNOSIS — Z510 Encounter for antineoplastic radiation therapy: Secondary | ICD-10-CM | POA: Diagnosis not present

## 2015-09-24 ENCOUNTER — Ambulatory Visit
Admission: RE | Admit: 2015-09-24 | Discharge: 2015-09-24 | Disposition: A | Discharge status: Home / Self Care | Payer: Commercial Managed Care - PPO | Source: Ambulatory Visit | Attending: Radiation Oncology | Admitting: Radiation Oncology

## 2015-09-24 ENCOUNTER — Encounter: Payer: Self-pay | Admitting: Radiation Oncology

## 2015-09-24 VITALS — BP 120/75 | HR 89 | Temp 98.0°F | Ht 67.0 in | Wt 152.6 lb

## 2015-09-24 DIAGNOSIS — Z510 Encounter for antineoplastic radiation therapy: Secondary | ICD-10-CM | POA: Diagnosis not present

## 2015-09-24 DIAGNOSIS — C8462 Anaplastic large cell lymphoma, ALK-positive, intrathoracic lymph nodes: Secondary | ICD-10-CM

## 2015-09-24 NOTE — Progress Notes (Signed)
Logan Mendoza has completed 15 fractions to his right pelvis.  He denies having pain or skin irritation.  He does report having tenderness to the touch in his right groin.  He denies having diarrhea or nausea.  He reports having slight fatigue.  He has been given a one month follow up appointment.  BP 120/75 mmHg  Pulse 89  Temp(Src) 98 F (36.7 C) (Oral)  Ht 5\' 7"  (1.702 m)  Wt 152 lb 9.6 oz (69.219 kg)  BMI 23.89 kg/m2  SpO2 99%  Wt Readings from Last 3 Encounters:  09/24/15 152 lb 9.6 oz (69.219 kg)  09/17/15 153 lb (69.4 kg)  09/03/15 151 lb 1.6 oz (68.539 kg)

## 2015-09-24 NOTE — Progress Notes (Signed)
  Radiation Oncology         [phone] ________________________________  Name: Logan Mendoza MRN: [id number]  Date: 09/24/2015  DOB: [birth year]  Weekly Radiation Therapy Management  Anaplastic large cell non-Hodgkin's lymphoma-ALK Presenting in multiple areas of osseous metastasis in the lower pelvis region.  Current Dose: 30 Gy     Planned Dose:  36 Gy  Narrative . . . . . . . . The patient presents for routine under treatment assessment.                                   Huber Conly has completed 15 fractions to his right pelvis. He denies having pain or skin irritation. He does report having tenderness to the touch in his right groin. He denies having diarrhea or nausea. He reports having slight fatigue. He has been given a one month follow up appointment.                                 Set-up films were reviewed.                                 The chart was checked. Physical Findings. . .  height is '5\' 7"'$  (1.702 m) and weight is 152 lb 9.6 oz (69.219 kg). His oral temperature is 98 F (36.7 C). His blood pressure is 120/75 and his pulse is 89. His oxygen saturation is 99%. . Weight essentially stable.  No significant changes.  Lungs are clear to auscultation bilaterally. Heart has regular rate and rhythm. No palpable cervical, supraclavicular, or axillary adenopathy. Abdomen soft, non-tender, normal bowel sounds. Impression . . . . . . . The patient is tolerating radiation. Plan . . . . . . . . . . . . Continue treatment as planned.  ________________________________   Blair Promise, PhD, MD

## 2015-09-25 ENCOUNTER — Ambulatory Visit
Admission: RE | Admit: 2015-09-25 | Discharge: 2015-09-25 | Disposition: A | Discharge status: Home / Self Care | Payer: Commercial Managed Care - PPO | Source: Ambulatory Visit | Attending: Radiation Oncology | Admitting: Radiation Oncology

## 2015-09-25 DIAGNOSIS — Z510 Encounter for antineoplastic radiation therapy: Secondary | ICD-10-CM | POA: Diagnosis not present

## 2015-09-26 ENCOUNTER — Ambulatory Visit
Admission: RE | Admit: 2015-09-26 | Discharge: 2015-09-26 | Disposition: A | Discharge status: Home / Self Care | Payer: Commercial Managed Care - PPO | Source: Ambulatory Visit | Attending: Radiation Oncology | Admitting: Radiation Oncology

## 2015-09-26 DIAGNOSIS — Z510 Encounter for antineoplastic radiation therapy: Secondary | ICD-10-CM | POA: Diagnosis not present

## 2015-09-27 ENCOUNTER — Ambulatory Visit
Admission: RE | Admit: 2015-09-27 | Discharge: 2015-09-27 | Disposition: A | Discharge status: Home / Self Care | Payer: Commercial Managed Care - PPO | Source: Ambulatory Visit | Attending: Radiation Oncology | Admitting: Radiation Oncology

## 2015-09-27 ENCOUNTER — Encounter: Payer: Self-pay | Admitting: Radiation Oncology

## 2015-09-27 VITALS — BP 117/78 | HR 91 | Temp 98.1°F | Ht 67.0 in | Wt 154.0 lb

## 2015-09-27 DIAGNOSIS — Z510 Encounter for antineoplastic radiation therapy: Secondary | ICD-10-CM | POA: Diagnosis not present

## 2015-09-27 DIAGNOSIS — C8462 Anaplastic large cell lymphoma, ALK-positive, intrathoracic lymph nodes: Secondary | ICD-10-CM

## 2015-09-27 MED ORDER — SONAFINE EX EMUL
1.0000 "application " | Freq: Once | CUTANEOUS | Status: AC
  Administered 2015-09-27: 1 via TOPICAL

## 2015-09-27 NOTE — Progress Notes (Signed)
Logan Mendoza has completed 18 fractions to his right pelvis.  He reports have aching pain 3 times in the afternoon in his right groin.  He said it lasted for 10-15 minutes and he said it feels like when he had a hernia in his left groin.  He denies having fatigue. He denies having urinary issues or diarrhea.  He noticed some hyperpigmentation to his right groin.  He has been given sonafine to use as needed.  BP 117/78 mmHg  Pulse 91  Temp(Src) 98.1 F (36.7 C) (Oral)  Ht 5\' 7"  (1.702 m)  Wt 154 lb (69.854 kg)  BMI 24.11 kg/m2  SpO2 99%   Wt Readings from Last 3 Encounters:  09/27/15 154 lb (69.854 kg)  09/24/15 152 lb 9.6 oz (69.219 kg)  09/17/15 153 lb (69.4 kg)

## 2015-09-27 NOTE — Progress Notes (Signed)
  Radiation Oncology         [phone] ________________________________  Name: Logan Mendoza MRN: [id number]  Date: 09/27/2015  DOB: [birth year]  Weekly Radiation Therapy Management   ICD-9-CM ICD-10-CM   1. Anaplastic large cell lymphoma, ALK-positive, intrathoracic lymph nodes (HCC) 200.62 C84.62 SONAFINE emulsion 1 application    Anaplastic large cell non-Hodgkin's lymphoma-ALK Presenting in multiple areas of osseous metastasis in the lower pelvis region.  Current Dose: 36 Gy     Planned Dose:  36 Gy  Narrative . . . . . . . . The patient presents for routine under treatment assessment.                                   Logan Mendoza has completed 18 fractions to his right pelvis. He reports have aching pain 3 times in the afternoon in his right groin.  He said it lasted for 10-15 minutes and he said it feels like when he had a hernia in his left groin.  He denies having fatigue. He denies having urinary issues or diarrhea.  He noticed some hyperpigmentation to his right groin.  He has been given sonafine to use as needed.                                  Set-up films were reviewed.                                  The chart was checked. Physical Findings. . .  height is '5\' 7"'$  (1.702 m) and weight is 154 lb (69.854 kg). His oral temperature is 98.1 F (36.7 C). His blood pressure is 117/78 and his pulse is 91. His oxygen saturation is 99%. . Well appearing, ambulatory  Impression . . . . . . . The patient has tolerated radiation. Plan . . . . . . . . . . . .f/u in 28mo-----------------------------------  SEppie Gibson MD

## 2015-09-30 ENCOUNTER — Telehealth: Payer: Self-pay | Admitting: *Deleted

## 2015-09-30 NOTE — Telephone Encounter (Signed)
CALLED PATIENT TO INFORM OF FU APPT. BEING MOVED FROM 11-14-15 TO 11-21-15 @ 9:20 AM, SPOKE WITH PATIENT AND HE AGREED TO THIS NEW DATE AND TIME

## 2015-10-09 NOTE — Progress Notes (Signed)
  Radiation Oncology         (336) [phone] ________________________________  Name: Logan Mendoza MRN: [id number]  Date: 09/27/2015  DOB: 1950/12/25  End of Treatment Note  Diagnosis: Anaplastic large cell non-Hodgkin's lymphoma-ALK. Presenting in multiple areas of osseous metastasis in the lower pelvis region.  Indication for treatment: Curative, residual after chemotherapy, significant bony destruction  Radiation treatment dates:   09/03/2015 - 09/27/2015  Site/dose: 36 Gy in 18 fractions to the right pelvis  Beams/energy: 3D // 15X Photon  Narrative: The patient tolerated radiation treatment relatively well. The patient's only complaints were tenderness and occasional instances of pain in the right groin.  Plan: The patient has completed radiation treatment. The patient will return to radiation oncology clinic for routine followup in one month. I advised them to call or return sooner if they have any questions or concerns related to their recovery or treatment.  -----------------------------------  Blair Promise, PhD, MD  This document serves as a record of services personally performed by Gery Pray, MD. It was created on his behalf by Darcus Austin, a trained medical scribe. The creation of this record is based on the scribe's personal observations and the provider's statements to them. This document has been checked and approved by the attending provider.

## 2015-10-23 ENCOUNTER — Other Ambulatory Visit (HOSPITAL_BASED_OUTPATIENT_CLINIC_OR_DEPARTMENT_OTHER): Payer: Commercial Managed Care - PPO

## 2015-10-23 ENCOUNTER — Ambulatory Visit (HOSPITAL_BASED_OUTPATIENT_CLINIC_OR_DEPARTMENT_OTHER): Payer: Commercial Managed Care - PPO | Admitting: Hematology & Oncology

## 2015-10-23 ENCOUNTER — Encounter: Payer: Self-pay | Admitting: Hematology & Oncology

## 2015-10-23 VITALS — BP 115/71 | HR 82 | Temp 97.8°F | Resp 18 | Ht 67.0 in | Wt 155.0 lb

## 2015-10-23 DIAGNOSIS — C8462 Anaplastic large cell lymphoma, ALK-positive, intrathoracic lymph nodes: Secondary | ICD-10-CM

## 2015-10-23 DIAGNOSIS — E559 Vitamin D deficiency, unspecified: Secondary | ICD-10-CM

## 2015-10-23 LAB — CBC WITH DIFFERENTIAL (CANCER CENTER ONLY)
BASO#: 0 10*3/uL (ref 0.0–0.2)
BASO%: 0.2 % (ref 0.0–2.0)
EOS%: 1.4 % (ref 0.0–7.0)
Eosinophils Absolute: 0.1 10*3/uL (ref 0.0–0.5)
HEMATOCRIT: 39.5 % (ref 38.7–49.9)
HGB: 14.2 g/dL (ref 13.0–17.1)
LYMPH#: 1 10*3/uL (ref 0.9–3.3)
LYMPH%: 20.8 % (ref 14.0–48.0)
MCH: 32.3 pg (ref 28.0–33.4)
MCHC: 35.9 g/dL (ref 32.0–35.9)
MCV: 90 fL (ref 82–98)
MONO#: 0.4 10*3/uL (ref 0.1–0.9)
MONO%: 8 % (ref 0.0–13.0)
NEUT%: 69.6 % (ref 40.0–80.0)
NEUTROS ABS: 3.5 10*3/uL (ref 1.5–6.5)
Platelets: 163 10*3/uL (ref 145–400)
RBC: 4.4 10*6/uL (ref 4.20–5.70)
RDW: 13.2 % (ref 11.1–15.7)
WBC: 5 10*3/uL (ref 4.0–10.0)

## 2015-10-23 LAB — COMPREHENSIVE METABOLIC PANEL
ALBUMIN: 3.8 g/dL (ref 3.5–5.0)
ALK PHOS: 70 U/L (ref 40–150)
ALT: 21 U/L (ref 0–55)
AST: 22 U/L (ref 5–34)
Anion Gap: 8 mEq/L (ref 3–11)
BUN: 14.3 mg/dL (ref 7.0–26.0)
CALCIUM: 9.2 mg/dL (ref 8.4–10.4)
CO2: 25 mEq/L (ref 22–29)
Chloride: 103 mEq/L (ref 98–109)
Creatinine: 0.9 mg/dL (ref 0.7–1.3)
Glucose: 113 mg/dl (ref 70–140)
POTASSIUM: 4.6 meq/L (ref 3.5–5.1)
Sodium: 136 mEq/L (ref 136–145)
Total Bilirubin: 0.45 mg/dL (ref 0.20–1.20)
Total Protein: 6.6 g/dL (ref 6.4–8.3)

## 2015-10-23 LAB — LACTATE DEHYDROGENASE: LDH: 166 U/L (ref 125–245)

## 2015-10-23 NOTE — Progress Notes (Signed)
Hematology and Oncology Follow Up Visit  Logan Mendoza [id number] 07/25/1950 65 y.o. 10/23/2015   Principle Diagnosis:   Anaplastic large cell non-Hodgkin's lymphoma-ALK (+)  Current Therapy:    Status post cycle 8 of CHOP  S/p XRT to the right pelvis-completed 09/27/2015     Interim History:  Logan Mendoza is back for a follow-up. He is doing well. He is exercising. His hair is coming back nicely. He really looks fit.  He did complete radiation therapy to the right pelvis. He received 36 Gy. He did this in 15 fractions. Again tolerated this well.  We will go ahead and repeat a PET scan on him. Hope, we will not see any evidence of residual activity.  He is working. He's having no problems with fatigue or weakness. His husband and he having do some traveling.  His appetite is good. He's had no nausea or vomiting. He's gained some weight. Allows weight gain is from him working out in his muscle weight.  There's been no change in bowel or bladder habits. He's had no rashes. He's had no headache. He's had no dysphagia or odynophagia.  He's had no cough.  Overall, his performance status is ECOG 0.  Medications:  Current outpatient prescriptions:  .  aspirin EC 81 MG tablet, Take 81 mg by mouth., Disp: , Rfl:  .  Cholecalciferol (VITAMIN D-1000 MAX ST) 1000 UNITS tablet, Take 2,000 Units by mouth 3 (three) times daily. , Disp: , Rfl:  .  co-enzyme Q-10 30 MG capsule, Take 200 mg by mouth daily. , Disp: , Rfl:  .  finasteride (PROSCAR) 5 MG tablet, Take 5 mg by mouth daily., Disp: , Rfl:  .  flecainide (TAMBOCOR) 50 MG tablet, Take 50 mg by mouth 2 (two) times daily. , Disp: , Rfl:  .  GLUCOSAMINE HCL PO, Take 1 tablet by mouth daily. , Disp: , Rfl:  .  Milk Thistle 140 MG CAPS, Take 250 mg by mouth daily. , Disp: , Rfl:  .  NIFEdipine (PROCARDIA-XL/ADALAT-CC/NIFEDICAL-XL) 30 MG 24 hr tablet, Take 30 mg by mouth., Disp: , Rfl:  .  Plant Sterols and Stanols (CHOLEST OFF PO), Take 300  mg by mouth daily. , Disp: , Rfl:  .  Red Yeast Rice Extract (RED YEAST RICE PO), Take 600 mg by mouth 2 (two) times daily. , Disp: , Rfl:  .  saw palmetto 500 MG capsule, Take 450 mg by mouth daily., Disp: , Rfl:  .  testosterone enanthate (DELATESTRYL) 200 MG/ML injection, Inject into the muscle every 14 (fourteen) days. 4 ml every 2 weeks, Disp: , Rfl:   Allergies:  Allergies  Allergen Reactions  . Sulfa Antibiotics Hives  . Sulfa Drugs Cross Reactors Itching and Rash    Past Medical History, Surgical history, Social history, and Family History were reviewed and updated.  Review of Systems: As above  Physical Exam:  height is 5' 7" (1.702 m) and weight is 155 lb (70.308 kg). His oral temperature is 97.8 F (36.6 C). His blood pressure is 115/71 and his pulse is 82. His respiration is 18.   Wt Readings from Last 3 Encounters:  10/23/15 155 lb (70.308 kg)  09/27/15 154 lb (69.854 kg)  09/24/15 152 lb 9.6 oz (69.219 kg)     Well-developed and well-nourished white gentleman head and neck exam shows no ocular lesions. There is no scleral icterus. He has no oral mucositis. There is no adenopathy in the neck. Lungs are clear bilaterally. Cardiac  exam regular rate and rhythm with no murmurs, rubs or bruits. Abdomen is soft. Has good bowel sounds. There is no fluid wave. There is no palpable liver or spleen tip. Back exam shows no tenderness over the spine, ribs or hips. External shows no clubbing, cyanosis or edema. He has good range motion of his joints. There is no tenderness over the hips bilaterally. Skin exam shows no rashes, ecchymoses or petechia. Neurological exam shows no focal neurological deficits.  Lab Results  Component Value Date   WBC 5.0 10/23/2015   HGB 14.2 10/23/2015   HCT 39.5 10/23/2015   MCV 90 10/23/2015   PLT 163 10/23/2015     Chemistry      Component Value Date/Time   NA 137 08/22/2015 1055   NA 138 07/18/2015 0816   NA 135 12/19/2014 2151   K 4.4  08/22/2015 1055   K 4.1 07/18/2015 0816   K 4.2 12/19/2014 2151   CL 102 08/22/2015 1055   CL 100* 12/19/2014 2151   CO2 25 08/22/2015 1055   CO2 23 07/18/2015 0816   CO2 25 12/19/2014 2151   BUN 15 08/22/2015 1055   BUN 15.2 07/18/2015 0816   BUN 18 12/19/2014 2151   CREATININE 0.9 08/22/2015 1055   CREATININE 0.8 07/18/2015 0816   CREATININE 0.77 12/19/2014 2151      Component Value Date/Time   CALCIUM 9.8 08/22/2015 1055   CALCIUM 8.9 07/18/2015 0816   CALCIUM 9.3 12/19/2014 2151   ALKPHOS 85* 08/22/2015 1055   ALKPHOS 96 07/18/2015 0816   ALKPHOS 140* 12/10/2014 1415   AST 32 08/22/2015 1055   AST 25 07/18/2015 0816   AST 14 12/10/2014 1415   ALT 32 08/22/2015 1055   ALT 22 07/18/2015 0816   ALT 10 12/10/2014 1415   BILITOT 0.70 08/22/2015 1055   BILITOT 0.44 07/18/2015 0816   BILITOT 0.3 12/10/2014 1415         Impression and Plan: Logan Mendoza is a 65 year old white male. He has anaplastic large cell non-Hodgkin's lymphoma. 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  We will go ahead and repeat his PET scan. I did this is some that we need to do after he had his radiation therapy. It cervical helpless with planning for any further interventions with therapy.   If everything was good on the PET scan, I will plan to see him back in 3 months.   I'm just so thankful that he has done so well today and that his quality of life is really good.   Volanda Napoleon, MD 7/12/20172:14 PM

## 2015-10-24 LAB — VITAMIN D 25 HYDROXY (VIT D DEFICIENCY, FRACTURES): Vitamin D, 25-Hydroxy: 44.5 ng/mL (ref 30.0–100.0)

## 2015-11-07 ENCOUNTER — Ambulatory Visit: Payer: Self-pay | Admitting: Radiation Oncology

## 2015-11-14 ENCOUNTER — Ambulatory Visit: Payer: Self-pay | Admitting: Radiation Oncology

## 2015-11-15 ENCOUNTER — Encounter: Payer: Self-pay | Admitting: Hematology & Oncology

## 2015-11-18 ENCOUNTER — Encounter: Payer: Self-pay | Admitting: Oncology

## 2015-11-18 ENCOUNTER — Ambulatory Visit (HOSPITAL_COMMUNITY)
Admission: RE | Admit: 2015-11-18 | Discharge: 2015-11-18 | Disposition: A | Discharge status: Home / Self Care | Payer: Commercial Managed Care - PPO | Source: Ambulatory Visit | Attending: Hematology & Oncology | Admitting: Hematology & Oncology

## 2015-11-18 ENCOUNTER — Telehealth: Payer: Self-pay | Admitting: Nurse Practitioner

## 2015-11-18 DIAGNOSIS — C8462 Anaplastic large cell lymphoma, ALK-positive, intrathoracic lymph nodes: Secondary | ICD-10-CM | POA: Insufficient documentation

## 2015-11-18 DIAGNOSIS — I2510 Atherosclerotic heart disease of native coronary artery without angina pectoris: Secondary | ICD-10-CM | POA: Insufficient documentation

## 2015-11-18 DIAGNOSIS — Z923 Personal history of irradiation: Secondary | ICD-10-CM | POA: Insufficient documentation

## 2015-11-18 DIAGNOSIS — I700 Atherosclerosis of aorta: Secondary | ICD-10-CM | POA: Insufficient documentation

## 2015-11-18 DIAGNOSIS — M899 Disorder of bone, unspecified: Secondary | ICD-10-CM | POA: Insufficient documentation

## 2015-11-18 LAB — GLUCOSE, CAPILLARY: GLUCOSE-CAPILLARY: 104 mg/dL — AB (ref 65–99)

## 2015-11-18 MED ORDER — FLUDEOXYGLUCOSE F - 18 (FDG) INJECTION
7.8100 | Freq: Once | INTRAVENOUS | Status: AC | PRN
  Administered 2015-11-18: 7.81 via INTRAVENOUS

## 2015-11-18 NOTE — Telephone Encounter (Addendum)
LVM on patient's personal machine ----- Message from Volanda Napoleon, MD sent at 11/18/2015  4:48 PM EDT ----- Call - NO activity to suggest recurrent NHL!!!  Laurey Arrow

## 2015-11-21 ENCOUNTER — Encounter: Payer: Self-pay | Admitting: Radiation Oncology

## 2015-11-21 ENCOUNTER — Ambulatory Visit
Admission: RE | Admit: 2015-11-21 | Discharge: 2015-11-21 | Disposition: A | Discharge status: Home / Self Care | Payer: Commercial Managed Care - PPO | Source: Ambulatory Visit | Attending: Radiation Oncology | Admitting: Radiation Oncology

## 2015-11-21 VITALS — BP 114/73 | HR 84 | Temp 98.0°F | Resp 12 | Wt 154.0 lb

## 2015-11-21 DIAGNOSIS — C8462 Anaplastic large cell lymphoma, ALK-positive, intrathoracic lymph nodes: Secondary | ICD-10-CM | POA: Diagnosis not present

## 2015-11-21 NOTE — Progress Notes (Signed)
Radiation Oncology         [phone] ________________________________  Name: Logan Mendoza MRN: [id number]  Date: 11/21/2015  DOB: [birth year]  Follow-Up Visit Note    CC: Virl Son., MD  Volanda Napoleon, MD    ICD-9-CM ICD-10-CM   1. Anaplastic large cell lymphoma, ALK-positive, intrathoracic lymph nodes (HCC) 200.62 C84.62     Diagnosis:   Anaplastic large cell non-Hodgkin's lymphoma-ALK. Presenting in multiple areas of osseous metastasis in the lower pelvis region.  Interval Since Last Radiation: 2 months, 5-23/2017-09/27/2015 36 Gy in 18 fractions to the right pelvis   Narrative:  The patient returns today for routine follow-up.   Pt complains of fatigue. Denies swollen glands, or lymph nodes.  Reports two weeks ago Right rear leg pain (rated 2/10) lasted approximately a week.  Has since alleviated.  He reports it could be from possible exercises. Patient denies numbness in his fingers, but minimal numbness in the back of his leg, but this may be due to starting exercise regularly again.                            ALLERGIES:  is allergic to sulfa antibiotics; sucralfate; and sulfa drugs cross reactors.  Meds: Current Outpatient Prescriptions  Medication Sig Dispense Refill  . aspirin EC 81 MG tablet Take 81 mg by mouth.    . Cholecalciferol (VITAMIN D-1000 MAX ST) 1000 UNITS tablet Take 2,000 Units by mouth 3 (three) times daily.     Marland Kitchen co-enzyme Q-10 30 MG capsule Take 200 mg by mouth daily.     . finasteride (PROSCAR) 5 MG tablet Take 5 mg by mouth daily.    . flecainide (TAMBOCOR) 50 MG tablet Take 50 mg by mouth 2 (two) times daily.     Marland Kitchen GLUCOSAMINE HCL PO Take 1 tablet by mouth daily.     . Milk Thistle 140 MG CAPS Take 250 mg by mouth daily.     Marland Kitchen NIFEdipine (PROCARDIA-XL/ADALAT-CC/NIFEDICAL-XL) 30 MG 24 hr tablet Take 30 mg by mouth.    . Plant Sterols and Stanols (CHOLEST OFF PO) Take 300 mg by mouth daily.     . Red Yeast Rice Extract (RED YEAST RICE PO)  Take 600 mg by mouth 2 (two) times daily.     . saw palmetto 500 MG capsule Take 450 mg by mouth daily.    Marland Kitchen testosterone enanthate (DELATESTRYL) 200 MG/ML injection Inject into the muscle every 14 (fourteen) days. 4 ml every 2 weeks     No current facility-administered medications for this encounter.     Physical Findings: The patient is in no acute distress. Patient is alert and oriented.  weight is 154 lb (69.9 kg). His oral temperature is 98 F (36.7 C). His blood pressure is 114/73 and his pulse is 84. His respiration is 12 and oxygen saturation is 98%. .    General: Alert and oriented, in no acute distress HEENT: Head is normocephalic. Extraocular movements are intact. Oropharynx is clear. Neck: Neck is supple, no palpable cervical or supraclavicular lymphadenopathy. Heart: Regular in rate and rhythm with no murmurs, rubs, or gallops. Chest: Clear to auscultation bilaterally, with no rhonchi, wheezes, or rales. Abdomen: Soft, nontender, nondistended, with no rigidity or guarding. Extremities: No cyanosis or edema. Lymphatics: see Neck Exam Skin: No concerning lesions. Musculoskeletal: symmetric strength and muscle tone throughout. Neurologic: Cranial nerves II through XII are grossly intact. No obvious focalities. Speech is fluent.  Coordination is intact. Psychiatric: Judgment and insight are intact. Affect is appropriate.     Lab Findings: Lab Results  Component Value Date   WBC 5.0 10/23/2015   HGB 14.2 10/23/2015   HCT 39.5 10/23/2015   MCV 90 10/23/2015   PLT 163 10/23/2015    Radiographic Findings: Nm Pet Image Restag (ps) Skull Base To Thigh  Result Date: 11/18/2015 CLINICAL DATA:  Subsequent treatment strategy for anaplastic large-cell lymphoma status post radiation therapy. EXAM: NUCLEAR MEDICINE PET SKULL BASE TO THIGH TECHNIQUE: 7.8 mCi F-18 FDG was injected intravenously. Full-ring PET imaging was performed from the skull base to thigh after the radiotracer.  CT data was obtained and used for attenuation correction and anatomic localization. FASTING BLOOD GLUCOSE:  Value: 104 mg/dl COMPARISON:  07/31/2015 PET-CT. FINDINGS: NECK No hypermetabolic lymph nodes in the neck. CHEST No hypermetabolic axillary, mediastinal or hilar nodes. Left anterior descending coronary atherosclerosis. No pleural effusions. Several subcentimeter pulmonary nodules scattered in the bilateral upper lobes and left lower lobe, most of which are calcified, all of which are unchanged back to 12/21/2014, considered benign. No acute consolidative airspace disease or new significant pulmonary nodules. ABDOMEN/PELVIS No abnormal hypermetabolic activity within the liver, pancreas, adrenal glands, or spleen. No hypermetabolic lymph nodes in the abdomen or pelvis. Probable focal adenomyomatosis at the gallbladder fundus, unchanged back to 12/21/2014. Simple right renal cysts measuring up to the 4.2 cm in the interpolar right kidney. No hydronephrosis. Stable mildly to moderately enlarged prostate. Atherosclerotic abdominal aorta. Mild sigmoid diverticulosis. SKELETON Mixed lytic and sclerotic lesion in the midline S1 sacrum is non hypermetabolic with max SUV 2.6, previously 3.0, decreased. Mixed lytic and sclerotic left iliac wing lesion is non hypermetabolic with max SUV 2.8, previously 2.8, unchanged. Mixed lytic and sclerotic right ischial lesion is non hypermetabolic with max SUV 3.6, previously 3.6, unchanged. No new hypermetabolic bone lesions. IMPRESSION: 1. No new or progressive hypermetabolic disease. Pelvic bone lesions are non hypermetabolic and demonstrate stable to decreased metabolism. 2. Aortic atherosclerosis.  Coronary atherosclerosis. Electronically Signed   By: Ilona Sorrel M.D.   On: 11/18/2015 11:02    Impression:  Patient is doing well clinically at this time. His PET scan as above shows no new problems or residual disease.  Plan:  Dr. Marin Olp will perform another PET scan on the  patient in approximately three months. I will release the patient to his care.   ____________________________________   This document serves as a record of services personally performed by Gery Pray, MD. It was created on his behalf by Truddie Hidden, a trained medical scribe. The creation of this record is based on the scribe's personal observations and the provider's statements to them. This document has been checked and approved by the attending provider.

## 2015-11-21 NOTE — Progress Notes (Signed)
PAIN: He is currently in no pain SKIN: Pt reports skin to pelvic area is warm dry and intact.   OTHER: Pt complains of fatigue. Denies swollen glands, or lymph nodes.  Reports two weeks ago Right rear leg pain (rated 2/10) lasted approximately a week.  Has since alleviated.  He reports it could be from possible exercises.   BP 114/73   Pulse 84   Temp 98 F (36.7 C) (Oral)   Resp 12   Wt 154 lb (69.9 kg)   SpO2 98%   BMI 24.12 kg/m  Wt Readings from Last 3 Encounters:  11/21/15 154 lb (69.9 kg)  10/23/15 155 lb (70.3 kg)  09/27/15 154 lb (69.9 kg)

## 2016-01-23 ENCOUNTER — Other Ambulatory Visit (HOSPITAL_BASED_OUTPATIENT_CLINIC_OR_DEPARTMENT_OTHER): Payer: Commercial Managed Care - PPO

## 2016-01-23 ENCOUNTER — Encounter: Payer: Self-pay | Admitting: Hematology & Oncology

## 2016-01-23 ENCOUNTER — Ambulatory Visit (HOSPITAL_BASED_OUTPATIENT_CLINIC_OR_DEPARTMENT_OTHER): Payer: Commercial Managed Care - PPO | Admitting: Hematology & Oncology

## 2016-01-23 VITALS — BP 119/78 | HR 77 | Temp 97.9°F | Resp 16 | Ht 67.0 in | Wt 160.0 lb

## 2016-01-23 DIAGNOSIS — C8462 Anaplastic large cell lymphoma, ALK-positive, intrathoracic lymph nodes: Secondary | ICD-10-CM | POA: Diagnosis not present

## 2016-01-23 LAB — CBC WITH DIFFERENTIAL (CANCER CENTER ONLY)
BASO#: 0 10*3/uL (ref 0.0–0.2)
BASO%: 0.2 % (ref 0.0–2.0)
EOS%: 1.3 % (ref 0.0–7.0)
Eosinophils Absolute: 0.1 10*3/uL (ref 0.0–0.5)
HCT: 43.9 % (ref 38.7–49.9)
HGB: 15.3 g/dL (ref 13.0–17.1)
LYMPH#: 1.4 10*3/uL (ref 0.9–3.3)
LYMPH%: 26.5 % (ref 14.0–48.0)
MCH: 31.8 pg (ref 28.0–33.4)
MCHC: 34.9 g/dL (ref 32.0–35.9)
MCV: 91 fL (ref 82–98)
MONO#: 0.5 10*3/uL (ref 0.1–0.9)
MONO%: 8.6 % (ref 0.0–13.0)
NEUT#: 3.5 10*3/uL (ref 1.5–6.5)
NEUT%: 63.4 % (ref 40.0–80.0)
PLATELETS: 203 10*3/uL (ref 145–400)
RBC: 4.81 10*6/uL (ref 4.20–5.70)
RDW: 13.2 % (ref 11.1–15.7)
WBC: 5.4 10*3/uL (ref 4.0–10.0)

## 2016-01-23 LAB — LACTATE DEHYDROGENASE: LDH: 150 U/L (ref 125–245)

## 2016-01-23 LAB — COMPREHENSIVE METABOLIC PANEL
ALT: 21 U/L (ref 0–55)
AST: 22 U/L (ref 5–34)
Albumin: 3.9 g/dL (ref 3.5–5.0)
Alkaline Phosphatase: 88 U/L (ref 40–150)
Anion Gap: 8 mEq/L (ref 3–11)
BUN: 12.6 mg/dL (ref 7.0–26.0)
CALCIUM: 9.7 mg/dL (ref 8.4–10.4)
CHLORIDE: 103 meq/L (ref 98–109)
CO2: 28 meq/L (ref 22–29)
CREATININE: 0.9 mg/dL (ref 0.7–1.3)
EGFR: 88 mL/min/{1.73_m2} — ABNORMAL LOW (ref >90)
Glucose: 92 mg/dl (ref 70–140)
POTASSIUM: 4.5 meq/L (ref 3.5–5.1)
SODIUM: 138 meq/L (ref 136–145)
Total Bilirubin: 0.56 mg/dL (ref 0.20–1.20)
Total Protein: 6.9 g/dL (ref 6.4–8.3)

## 2016-01-23 NOTE — Progress Notes (Signed)
Hematology and Oncology Follow Up Visit  Logan Mendoza [id number] 12/30/1950 65 y.o. 01/23/2016   Principle Diagnosis:   Anaplastic large cell non-Hodgkin's lymphoma-ALK (+)  Current Therapy:    Status post cycle 8 of CHOP - completed in 06/06/2015  S/p XRT to the right pelvis-completed 09/27/2015     Interim History:  Logan Mendoza is back for a follow-up. He is doing well. He is exercising. His hair has come back nicely.  He is working without difficulties. His stamina is improving.  We did do a PET scan on back in August. The PET scan did not show any evidence of recurrent disease.  He still has some discomfort in the left sacroiliac region. Specifically, in the left posterior iliac crest region, there is little bit of discomfort. This comes and goes. He has a hard time lying on his left side.  He has had no fever. He has had no nausea or vomiting. There's been no change in bowel or bladder habits. He may have a little bit of urinary frequency.  There's been no bleeding.  He and his husband celebrated her first year anniversary in September.  He has had no leg swelling. He has had no rashes.  There's been no headache.  Overall, his performance status is ECOG 0. H   Medications:  Current Outpatient Prescriptions:  .  aspirin EC 81 MG tablet, Take 81 mg by mouth., Disp: , Rfl:  .  Cholecalciferol (VITAMIN D-1000 MAX ST) 1000 UNITS tablet, Take 2,000 Units by mouth 3 (three) times daily. , Disp: , Rfl:  .  co-enzyme Q-10 30 MG capsule, Take 200 mg by mouth daily. , Disp: , Rfl:  .  finasteride (PROSCAR) 5 MG tablet, Take 5 mg by mouth daily., Disp: , Rfl:  .  flecainide (TAMBOCOR) 50 MG tablet, Take 50 mg by mouth 2 (two) times daily. , Disp: , Rfl:  .  GLUCOSAMINE HCL PO, Take 1 tablet by mouth daily. , Disp: , Rfl:  .  Milk Thistle 140 MG CAPS, Take 250 mg by mouth daily. , Disp: , Rfl:  .  NIFEdipine (PROCARDIA-XL/ADALAT-CC/NIFEDICAL-XL) 30 MG 24 hr tablet, Take 30 mg by  mouth., Disp: , Rfl:  .  Plant Sterols and Stanols (CHOLEST OFF PO), Take 300 mg by mouth daily. , Disp: , Rfl:  .  Red Yeast Rice Extract (RED YEAST RICE PO), Take 600 mg by mouth 2 (two) times daily. , Disp: , Rfl:  .  saw palmetto 500 MG capsule, Take 450 mg by mouth daily., Disp: , Rfl:  .  testosterone enanthate (DELATESTRYL) 200 MG/ML injection, Inject into the muscle every 14 (fourteen) days. 4 ml every 2 weeks, Disp: , Rfl:   Allergies:  Allergies  Allergen Reactions  . Sulfa Antibiotics Hives  . Sucralfate   . Sulfa Drugs Cross Reactors Itching and Rash    Past Medical History, Surgical history, Social history, and Family History were reviewed and updated.  Review of Systems: As above  Physical Exam:  height is 5' 7"  (1.702 m) and weight is 160 lb (72.6 kg). His oral temperature is 97.9 F (36.6 C). His blood pressure is 119/78 and his pulse is 77. His respiration is 16.   Wt Readings from Last 3 Encounters:  01/23/16 160 lb (72.6 kg)  11/21/15 154 lb (69.9 kg)  10/23/15 155 lb (70.3 kg)     Well-developed and well-nourished white gentleman head and neck exam shows no ocular lesions. There is no scleral  icterus. He has no oral mucositis. There is no adenopathy in the neck. Lungs are clear bilaterally. Cardiac exam regular rate and rhythm with no murmurs, rubs or bruits. Abdomen is soft. Has good bowel sounds. There is no fluid wave. There is no palpable liver or spleen tip. Back exam shows no tenderness over the spine, ribs or hips. External shows no clubbing, cyanosis or edema. He has good range motion of his joints. There is no tenderness over the hips bilaterally. Skin exam shows no rashes, ecchymoses or petechia. Neurological exam shows no focal neurological deficits.  Lab Results  Component Value Date   WBC 5.4 01/23/2016   HGB 15.3 01/23/2016   HCT 43.9 01/23/2016   MCV 91 01/23/2016   PLT 203 01/23/2016     Chemistry      Component Value Date/Time   NA 136  10/23/2015 1315   K 4.6 10/23/2015 1315   CL 102 08/22/2015 1055   CO2 25 10/23/2015 1315   BUN 14.3 10/23/2015 1315   CREATININE 0.9 10/23/2015 1315      Component Value Date/Time   CALCIUM 9.2 10/23/2015 1315   ALKPHOS 70 10/23/2015 1315   AST 22 10/23/2015 1315   ALT 21 10/23/2015 1315   BILITOT 0.45 10/23/2015 1315         Impression and Plan: Logan Mendoza is a 65 year old white male. He has anaplastic large cell non-Hodgkin's lymphoma. 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  For now, I just do not think he has any recurrent disease. I know that he is at risk for recurrence. I don't think we need any scans on him right now.   I talked to him again about using a bone hardener. He does not think that he would feel comfortable doing that at this point in time.   We will continue to follow him in 3 months. I told him that we would follow him every 3 months for 1 year and then every 4 months for the next year.    Volanda Napoleon, MD 10/12/20172:08 PM

## 2016-02-27 ENCOUNTER — Ambulatory Visit: Payer: Commercial Managed Care - PPO | Admitting: Hematology & Oncology

## 2016-02-27 ENCOUNTER — Other Ambulatory Visit: Payer: Commercial Managed Care - PPO

## 2016-04-30 ENCOUNTER — Other Ambulatory Visit: Payer: Commercial Managed Care - PPO

## 2016-04-30 ENCOUNTER — Ambulatory Visit: Payer: Commercial Managed Care - PPO | Admitting: Hematology & Oncology

## 2016-05-13 ENCOUNTER — Ambulatory Visit (HOSPITAL_BASED_OUTPATIENT_CLINIC_OR_DEPARTMENT_OTHER): Payer: Commercial Managed Care - PPO | Admitting: Hematology & Oncology

## 2016-05-13 ENCOUNTER — Other Ambulatory Visit (HOSPITAL_BASED_OUTPATIENT_CLINIC_OR_DEPARTMENT_OTHER): Payer: Commercial Managed Care - PPO

## 2016-05-13 VITALS — BP 110/61 | HR 43 | Temp 97.6°F | Wt 160.2 lb

## 2016-05-13 DIAGNOSIS — C8462 Anaplastic large cell lymphoma, ALK-positive, intrathoracic lymph nodes: Secondary | ICD-10-CM

## 2016-05-13 LAB — CBC WITH DIFFERENTIAL (CANCER CENTER ONLY)
BASO#: 0 10*3/uL (ref 0.0–0.2)
BASO%: 0.4 % (ref 0.0–2.0)
EOS%: 2.4 % (ref 0.0–7.0)
Eosinophils Absolute: 0.1 10*3/uL (ref 0.0–0.5)
HEMATOCRIT: 47.5 % (ref 38.7–49.9)
HGB: 16.4 g/dL (ref 13.0–17.1)
LYMPH#: 1.4 10*3/uL (ref 0.9–3.3)
LYMPH%: 25.6 % (ref 14.0–48.0)
MCH: 31.2 pg (ref 28.0–33.4)
MCHC: 34.5 g/dL (ref 32.0–35.9)
MCV: 90 fL (ref 82–98)
MONO#: 0.4 10*3/uL (ref 0.1–0.9)
MONO%: 7.5 % (ref 0.0–13.0)
NEUT#: 3.5 10*3/uL (ref 1.5–6.5)
NEUT%: 64.1 % (ref 40.0–80.0)
PLATELETS: 197 10*3/uL (ref 145–400)
RBC: 5.26 10*6/uL (ref 4.20–5.70)
RDW: 13.2 % (ref 11.1–15.7)
WBC: 5.5 10*3/uL (ref 4.0–10.0)

## 2016-05-13 LAB — CMP (CANCER CENTER ONLY)
ALBUMIN: 3.9 g/dL (ref 3.3–5.5)
ALT(SGPT): 29 U/L (ref 10–47)
AST: 29 U/L (ref 11–38)
Alkaline Phosphatase: 79 U/L (ref 26–84)
BILIRUBIN TOTAL: 0.8 mg/dL (ref 0.20–1.60)
BUN, Bld: 16 mg/dL (ref 7–22)
CALCIUM: 9.3 mg/dL (ref 8.0–10.3)
CHLORIDE: 101 meq/L (ref 98–108)
CO2: 27 meq/L (ref 18–33)
Creat: 1.2 mg/dl (ref 0.6–1.2)
GLUCOSE: 100 mg/dL (ref 73–118)
POTASSIUM: 4.2 meq/L (ref 3.3–4.7)
Sodium: 139 mEq/L (ref 128–145)
Total Protein: 6.6 g/dL (ref 6.4–8.1)

## 2016-05-13 LAB — LACTATE DEHYDROGENASE: LDH: 165 U/L (ref 125–245)

## 2016-05-13 NOTE — Progress Notes (Signed)
Hematology and Oncology Follow Up Visit  Logan Mendoza [id number] [birth year] 66 y.o. 05/13/2016   Principle Diagnosis:   Anaplastic large cell non-Hodgkin's lymphoma-ALK (+)  Current Therapy:    Status post cycle 8 of CHOP - completed in 06/06/2015  S/p XRT to the right pelvis-completed 09/27/2015     Interim History:  Logan Mendoza is back for a follow-up. He is doing well. He is exercising. His hair has come back nicely.  He is working without difficulties. His stamina is improving.  He had a good holiday season. It was pretty quiet for he and his husband.  He is working. He is enjoying work. He has been pretty busy at work.   He's not complaining of any pain. He said he had a transient episode of pain down the right leg. This happened for a couple days and then went away on its own.   He has not noted any rashes. He's had no change in bowel or bladder habits. He's had no cough. He has managed to get through the flu season without any infections.   He is looking forward to going to car shows. They start back up in March.   Overall, his performance status is ECOG 0.   Medications:  Current Outpatient Prescriptions:  .  aspirin EC 81 MG tablet, Take 81 mg by mouth., Disp: , Rfl:  .  Cholecalciferol (VITAMIN D-1000 MAX ST) 1000 UNITS tablet, Take 2,000 Units by mouth 3 (three) times daily. , Disp: , Rfl:  .  co-enzyme Q-10 30 MG capsule, Take 200 mg by mouth daily. , Disp: , Rfl:  .  finasteride (PROSCAR) 5 MG tablet, Take 5 mg by mouth daily., Disp: , Rfl:  .  flecainide (TAMBOCOR) 50 MG tablet, Take 50 mg by mouth 2 (two) times daily. , Disp: , Rfl:  .  GLUCOSAMINE HCL PO, Take 1 tablet by mouth daily. , Disp: , Rfl:  .  Milk Thistle 140 MG CAPS, Take 250 mg by mouth daily. , Disp: , Rfl:  .  NIFEdipine (PROCARDIA-XL/ADALAT-CC/NIFEDICAL-XL) 30 MG 24 hr tablet, Take 30 mg by mouth., Disp: , Rfl:  .  Plant Sterols and Stanols (CHOLEST OFF PO), Take 300 mg by mouth daily. , Disp:  , Rfl:  .  Red Yeast Rice Extract (RED YEAST RICE PO), Take 600 mg by mouth 2 (two) times daily. , Disp: , Rfl:  .  saw palmetto 500 MG capsule, Take 450 mg by mouth daily., Disp: , Rfl:  .  testosterone enanthate (DELATESTRYL) 200 MG/ML injection, Inject into the muscle every 14 (fourteen) days. 4 ml every 2 weeks, Disp: , Rfl:   Allergies:  Allergies  Allergen Reactions  . Sulfa Antibiotics Hives  . Sucralfate   . Sulfa Drugs Cross Reactors Itching and Rash    Past Medical History, Surgical history, Social history, and Family History were reviewed and updated.  Review of Systems: As above  Physical Exam:  weight is 160 lb 4 oz (72.7 kg). His temperature is 97.6 F (36.4 C). His blood pressure is 110/61 and his pulse is 43 (abnormal).   Wt Readings from Last 3 Encounters:  05/13/16 160 lb 4 oz (72.7 kg)  01/23/16 160 lb (72.6 kg)  11/21/15 154 lb (69.9 kg)     Well-developed and well-nourished white gentleman head and neck exam shows no ocular lesions. There is no scleral icterus. He has no oral mucositis. There is no adenopathy in the neck. Lungs are clear bilaterally. Cardiac  exam regular rate and rhythm with no murmurs, rubs or bruits. Abdomen is soft. Has good bowel sounds. There is no fluid wave. There is no palpable liver or spleen tip. Back exam shows no tenderness over the spine, ribs or hips. External shows no clubbing, cyanosis or edema. He has good range motion of his joints. There is no tenderness over the hips bilaterally. Skin exam shows no rashes, ecchymoses or petechia. Neurological exam shows no focal neurological deficits.  Lab Results  Component Value Date   WBC 5.5 05/13/2016   HGB 16.4 05/13/2016   HCT 47.5 05/13/2016   MCV 90 05/13/2016   PLT 197 05/13/2016     Chemistry      Component Value Date/Time   NA 138 01/23/2016 1301   K 4.5 01/23/2016 1301   CL 102 08/22/2015 1055   CO2 28 01/23/2016 1301   BUN 12.6 01/23/2016 1301   CREATININE 0.9  01/23/2016 1301      Component Value Date/Time   CALCIUM 9.7 01/23/2016 1301   ALKPHOS 88 01/23/2016 1301   AST 22 01/23/2016 1301   ALT 21 01/23/2016 1301   BILITOT 0.56 01/23/2016 1301         Impression and Plan: Logan Mendoza is a 33 year Mendoza white male. He has anaplastic large cell non-Hodgkin's lymphoma. 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  For now, I just do not think he has any recurrent disease. I know that he is at risk for recurrence. I don't think we need any scans on him right now.   I talked to him again about using a bone hardener. He does not think that he would feel comfortable doing that at this point in time.   I will now plan to get him back in 4 months. It has been a year since his treatment. We still do not need any scans.  Volanda Napoleon, MD 1/31/20188:34 AM

## 2016-09-11 ENCOUNTER — Encounter: Payer: Self-pay | Admitting: *Deleted

## 2016-09-11 ENCOUNTER — Other Ambulatory Visit (HOSPITAL_BASED_OUTPATIENT_CLINIC_OR_DEPARTMENT_OTHER): Payer: Commercial Managed Care - PPO

## 2016-09-11 ENCOUNTER — Ambulatory Visit (HOSPITAL_BASED_OUTPATIENT_CLINIC_OR_DEPARTMENT_OTHER): Payer: Commercial Managed Care - PPO | Admitting: Hematology & Oncology

## 2016-09-11 DIAGNOSIS — E559 Vitamin D deficiency, unspecified: Secondary | ICD-10-CM | POA: Insufficient documentation

## 2016-09-11 DIAGNOSIS — C8462 Anaplastic large cell lymphoma, ALK-positive, intrathoracic lymph nodes: Secondary | ICD-10-CM

## 2016-09-11 DIAGNOSIS — G63 Polyneuropathy in diseases classified elsewhere: Secondary | ICD-10-CM

## 2016-09-11 DIAGNOSIS — C801 Malignant (primary) neoplasm, unspecified: Secondary | ICD-10-CM

## 2016-09-11 LAB — CBC WITH DIFFERENTIAL (CANCER CENTER ONLY)
BASO#: 0 10*3/uL (ref 0.0–0.2)
BASO%: 0.3 % (ref 0.0–2.0)
EOS%: 2.3 % (ref 0.0–7.0)
Eosinophils Absolute: 0.1 10*3/uL (ref 0.0–0.5)
HCT: 44.8 % (ref 38.7–49.9)
HGB: 16 g/dL (ref 13.0–17.1)
LYMPH#: 1 10*3/uL (ref 0.9–3.3)
LYMPH%: 24.2 % (ref 14.0–48.0)
MCH: 31.4 pg (ref 28.0–33.4)
MCHC: 35.7 g/dL (ref 32.0–35.9)
MCV: 88 fL (ref 82–98)
MONO#: 0.3 10*3/uL (ref 0.1–0.9)
MONO%: 6.8 % (ref 0.0–13.0)
NEUT#: 2.6 10*3/uL (ref 1.5–6.5)
NEUT%: 66.4 % (ref 40.0–80.0)
PLATELETS: 178 10*3/uL (ref 145–400)
RBC: 5.1 10*6/uL (ref 4.20–5.70)
RDW: 12.9 % (ref 11.1–15.7)
WBC: 4 10*3/uL (ref 4.0–10.0)

## 2016-09-11 LAB — COMPREHENSIVE METABOLIC PANEL
ALBUMIN: 4 g/dL (ref 3.5–5.0)
ALK PHOS: 76 U/L (ref 40–150)
ALT: 24 U/L (ref 0–55)
AST: 25 U/L (ref 5–34)
Anion Gap: 8 mEq/L (ref 3–11)
BILIRUBIN TOTAL: 0.48 mg/dL (ref 0.20–1.20)
BUN: 13.6 mg/dL (ref 7.0–26.0)
CO2: 22 mEq/L (ref 22–29)
CREATININE: 0.9 mg/dL (ref 0.7–1.3)
Calcium: 9 mg/dL (ref 8.4–10.4)
Chloride: 106 mEq/L (ref 98–109)
EGFR: 90 mL/min/{1.73_m2} — AB (ref >90)
GLUCOSE: 102 mg/dL (ref 70–140)
Potassium: 4.1 mEq/L (ref 3.5–5.1)
SODIUM: 137 meq/L (ref 136–145)
TOTAL PROTEIN: 6.6 g/dL (ref 6.4–8.3)

## 2016-09-11 LAB — LACTATE DEHYDROGENASE: LDH: 161 U/L (ref 125–245)

## 2016-09-11 MED ORDER — VITAMIN B-6 250 MG PO TABS: 250.0000 mg | ORAL_TABLET | Freq: Every day | ORAL | 3 refills | Status: DC

## 2016-09-11 MED FILL — VITAMIN B-6 50 MG TABLET: 50 | 20 days supply | Qty: 100 | Fill #0

## 2016-09-11 NOTE — Progress Notes (Signed)
Hematology and Oncology Follow Up Visit  Logan Mendoza [id number] 1951-01-08 66 y.o. 09/11/2016   Principle Diagnosis:   Anaplastic large cell non-Hodgkin's lymphoma-ALK (+)  Current Therapy:    Status post cycle 8 of CHOP - completed in 06/06/2015  S/p XRT to the right pelvis-completed 09/27/2015     Interim History:  Logan Mendoza is back for a follow-up. He is doing well. He is exercising. His hair has come back nicely.  He is working without difficulties. His stamina is improving.  He had a good Memorial Day weekend. He and his husband went to an antique car show. I think they will be going to a car rally this weekend.  He is working. He is enjoying work. He has been pretty busy at work.   He's not complaining of any pain.  He has not noted any rashes. He's had no change in bowel or bladder habits. He's had no cough. He has managed to get through the flu season without any infections.   He has has a home will be going down to the [place] to visit family. I told him to make sure he drinks a lot of water and wears sunscreen.  Overall, his performance status is ECOG 0.   Medications:  Current Outpatient Prescriptions:  .  aspirin EC 81 MG tablet, Take 81 mg by mouth., Disp: , Rfl:  .  Cholecalciferol (VITAMIN D-1000 MAX ST) 1000 UNITS tablet, Take 2,000 Units by mouth 3 (three) times daily. , Disp: , Rfl:  .  co-enzyme Q-10 30 MG capsule, Take 200 mg by mouth daily. , Disp: , Rfl:  .  finasteride (PROSCAR) 5 MG tablet, Take 5 mg by mouth daily., Disp: , Rfl:  .  GLUCOSAMINE HCL PO, Take 1 tablet by mouth daily. , Disp: , Rfl:  .  Milk Thistle 140 MG CAPS, Take 250 mg by mouth daily. , Disp: , Rfl:  .  NIFEdipine (PROCARDIA-XL/ADALAT-CC/NIFEDICAL-XL) 30 MG 24 hr tablet, Take 30 mg by mouth., Disp: , Rfl:  .  Plant Sterols and Stanols (CHOLEST OFF PO), Take 300 mg by mouth daily. , Disp: , Rfl:  .  Red Yeast Rice Extract (RED YEAST RICE PO), Take 600 mg by mouth 2 (two) times daily.  , Disp: , Rfl:  .  saw palmetto 500 MG capsule, Take 450 mg by mouth daily., Disp: , Rfl:  .  testosterone enanthate (DELATESTRYL) 200 MG/ML injection, Inject into the muscle every 14 (fourteen) days. 4 ml every 2 weeks, Disp: , Rfl:   Allergies:  Allergies  Allergen Reactions  . Sulfa Antibiotics Hives  . Sucralfate   . Sulfa Drugs Cross Reactors Itching and Rash    Past Medical History, Surgical history, Social history, and Family History were reviewed and updated.  Review of Systems: As above  Physical Exam:  vitals were not taken for this visit.  Wt Readings from Last 3 Encounters:  05/13/16 160 lb 4 oz (72.7 kg)  01/23/16 160 lb (72.6 kg)  11/21/15 154 lb (69.9 kg)     Well-developed and well-nourished white gentleman head and neck exam shows no ocular lesions. There is no scleral icterus. He has no oral mucositis. There is no adenopathy in the neck. Lungs are clear bilaterally. Cardiac exam regular rate and rhythm with no murmurs, rubs or bruits. Abdomen is soft. Has good bowel sounds. There is no fluid wave. There is no palpable liver or spleen tip. Back exam shows no tenderness over the spine, ribs  or hips. External shows no clubbing, cyanosis or edema. He has good range motion of his joints. There is no tenderness over the hips bilaterally. Skin exam shows no rashes, ecchymoses or petechia. Neurological exam shows no focal neurological deficits.  Lab Results  Component Value Date   WBC 4.0 09/11/2016   HGB 16.0 09/11/2016   HCT 44.8 09/11/2016   MCV 88 09/11/2016   PLT 178 09/11/2016     Chemistry      Component Value Date/Time   NA 139 05/13/2016 0839   NA 138 01/23/2016 1301   K 4.2 05/13/2016 0839   K 4.5 01/23/2016 1301   CL 101 05/13/2016 0839   CO2 27 05/13/2016 0839   CO2 28 01/23/2016 1301   BUN 16 05/13/2016 0839   BUN 12.6 01/23/2016 1301   CREATININE 1.2 05/13/2016 0839   CREATININE 0.9 01/23/2016 1301      Component Value Date/Time   CALCIUM  9.3 05/13/2016 0839   CALCIUM 9.7 01/23/2016 1301   ALKPHOS 79 05/13/2016 0839   ALKPHOS 88 01/23/2016 1301   AST 29 05/13/2016 0839   AST 22 01/23/2016 1301   ALT 29 05/13/2016 0839   ALT 21 01/23/2016 1301   BILITOT 0.80 05/13/2016 0839   BILITOT 0.56 01/23/2016 1301         Impression and Plan: Logan Mendoza is a 66 year old white male. He has anaplastic large cell non-Hodgkin's lymphoma. 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  For now, I just do not think he has any recurrent disease. I know that he is at risk for recurrence. I don't think we need any scans on him right now.   He is taking vitamin D. I think this is very helpful.  I would plan to get him back in 6 months. I think this would be reasonable.   Volanda Napoleon, MD 6/1/20189:41 AM

## 2016-09-11 NOTE — Addendum Note (Signed)
Addended by: Burney Gauze R on: 09/11/2016 04:05 PM   Modules accepted: Orders

## 2016-09-12 LAB — VITAMIN D 25 HYDROXY (VIT D DEFICIENCY, FRACTURES): Vitamin D, 25-Hydroxy: 51.3 ng/mL (ref 30.0–100.0)

## 2017-03-19 ENCOUNTER — Other Ambulatory Visit (HOSPITAL_BASED_OUTPATIENT_CLINIC_OR_DEPARTMENT_OTHER): Payer: Commercial Managed Care - PPO

## 2017-03-19 ENCOUNTER — Other Ambulatory Visit: Payer: Self-pay

## 2017-03-19 ENCOUNTER — Ambulatory Visit (HOSPITAL_BASED_OUTPATIENT_CLINIC_OR_DEPARTMENT_OTHER): Payer: Commercial Managed Care - PPO | Admitting: Hematology & Oncology

## 2017-03-19 ENCOUNTER — Encounter: Payer: Self-pay | Admitting: Hematology & Oncology

## 2017-03-19 VITALS — BP 128/87 | HR 79 | Temp 97.7°F | Resp 16 | Wt 161.0 lb

## 2017-03-19 DIAGNOSIS — C8462 Anaplastic large cell lymphoma, ALK-positive, intrathoracic lymph nodes: Secondary | ICD-10-CM | POA: Diagnosis not present

## 2017-03-19 DIAGNOSIS — E559 Vitamin D deficiency, unspecified: Secondary | ICD-10-CM

## 2017-03-19 DIAGNOSIS — G63 Polyneuropathy in diseases classified elsewhere: Secondary | ICD-10-CM

## 2017-03-19 DIAGNOSIS — C801 Malignant (primary) neoplasm, unspecified: Secondary | ICD-10-CM

## 2017-03-19 LAB — CBC WITH DIFFERENTIAL (CANCER CENTER ONLY)
BASO#: 0 10*3/uL (ref 0.0–0.2)
BASO%: 0.2 % (ref 0.0–2.0)
EOS ABS: 0.1 10*3/uL (ref 0.0–0.5)
EOS%: 2 % (ref 0.0–7.0)
HCT: 46.7 % (ref 38.7–49.9)
HEMOGLOBIN: 16.4 g/dL (ref 13.0–17.1)
LYMPH#: 1.4 10*3/uL (ref 0.9–3.3)
LYMPH%: 24.1 % (ref 14.0–48.0)
MCH: 31.4 pg (ref 28.0–33.4)
MCHC: 35.1 g/dL (ref 32.0–35.9)
MCV: 90 fL (ref 82–98)
MONO#: 0.5 10*3/uL (ref 0.1–0.9)
MONO%: 8.4 % (ref 0.0–13.0)
NEUT%: 65.3 % (ref 40.0–80.0)
NEUTROS ABS: 3.9 10*3/uL (ref 1.5–6.5)
PLATELETS: 201 10*3/uL (ref 145–400)
RBC: 5.22 10*6/uL (ref 4.20–5.70)
RDW: 13.3 % (ref 11.1–15.7)
WBC: 5.9 10*3/uL (ref 4.0–10.0)

## 2017-03-19 LAB — LACTATE DEHYDROGENASE: LDH: 174 U/L (ref 125–245)

## 2017-03-19 LAB — CMP (CANCER CENTER ONLY)
ALBUMIN: 3.7 g/dL (ref 3.3–5.5)
ALT(SGPT): 32 U/L (ref 10–47)
AST: 24 U/L (ref 11–38)
Alkaline Phosphatase: 69 U/L (ref 26–84)
BILIRUBIN TOTAL: 0.9 mg/dL (ref 0.20–1.60)
BUN, Bld: 16 mg/dL (ref 7–22)
CO2: 25 meq/L (ref 18–33)
CREATININE: 1 mg/dL (ref 0.6–1.2)
Calcium: 9 mg/dL (ref 8.0–10.3)
Chloride: 103 mEq/L (ref 98–108)
GLUCOSE: 107 mg/dL (ref 73–118)
Potassium: 4 mEq/L (ref 3.3–4.7)
SODIUM: 140 meq/L (ref 128–145)
Total Protein: 6.5 g/dL (ref 6.4–8.1)
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
## 2017-03-20 LAB — VITAMIN D 25 HYDROXY (VIT D DEFICIENCY, FRACTURES): VIT D 25 HYDROXY: 52.6 ng/mL (ref 30.0–100.0)

## 2017-06-12 IMAGING — DX DG HIP (WITH OR WITHOUT PELVIS) 2-3V*L*
3 series · 3 of 3 positions shown · non-contrast
Comparison: PET CT 12/21/2014

CLINICAL DATA: Weakness in left hip since [REDACTED]. History of
lymphoma.

EXAM:
DG HIP (WITH OR WITHOUT PELVIS) 2-3V LEFT

[pelvis ap]
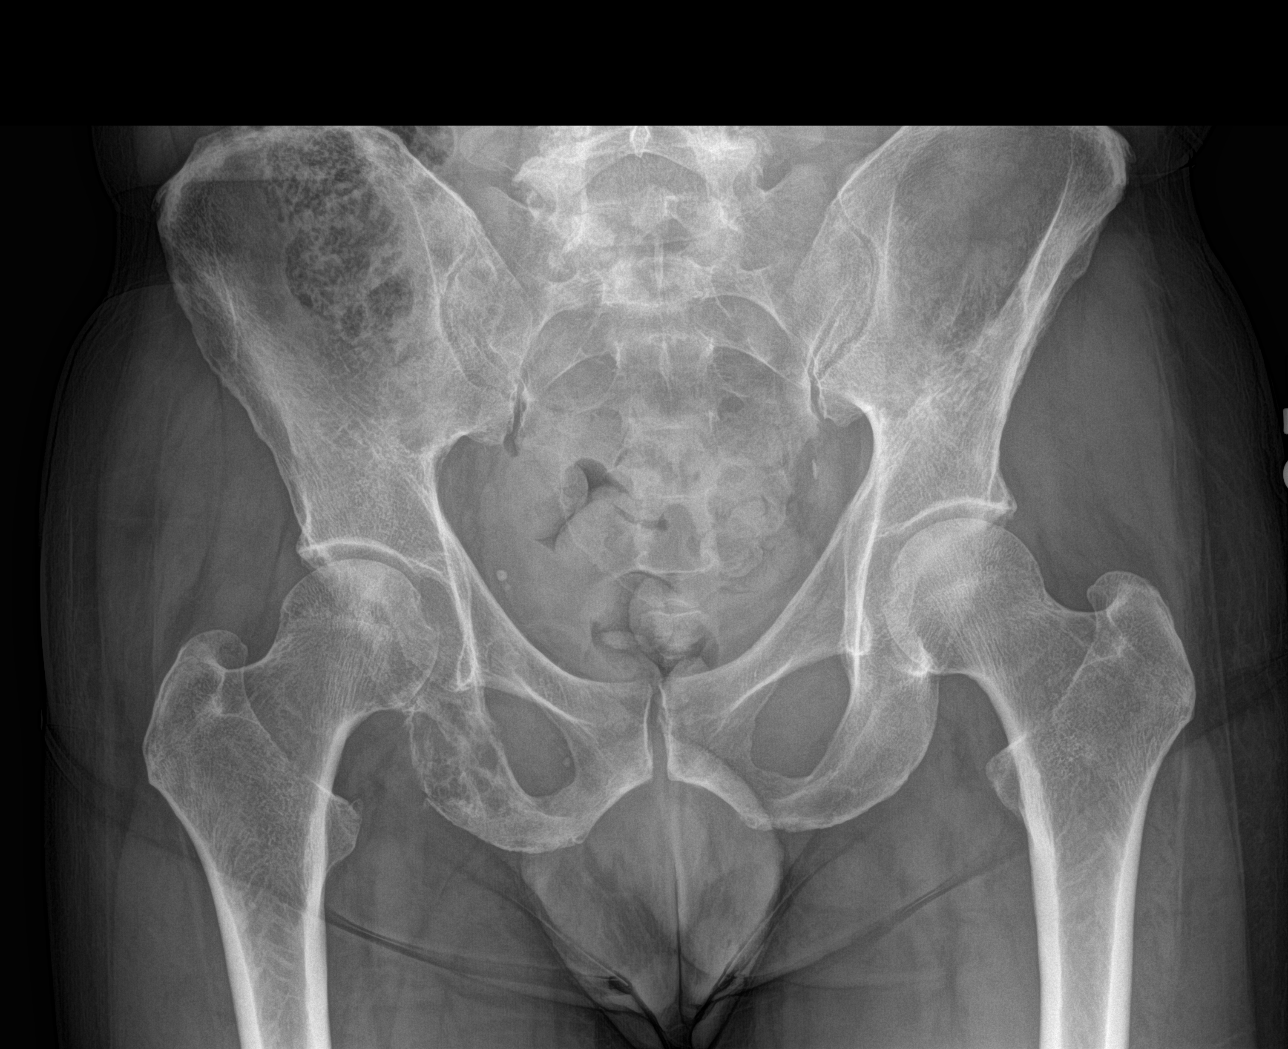

[hip ap]
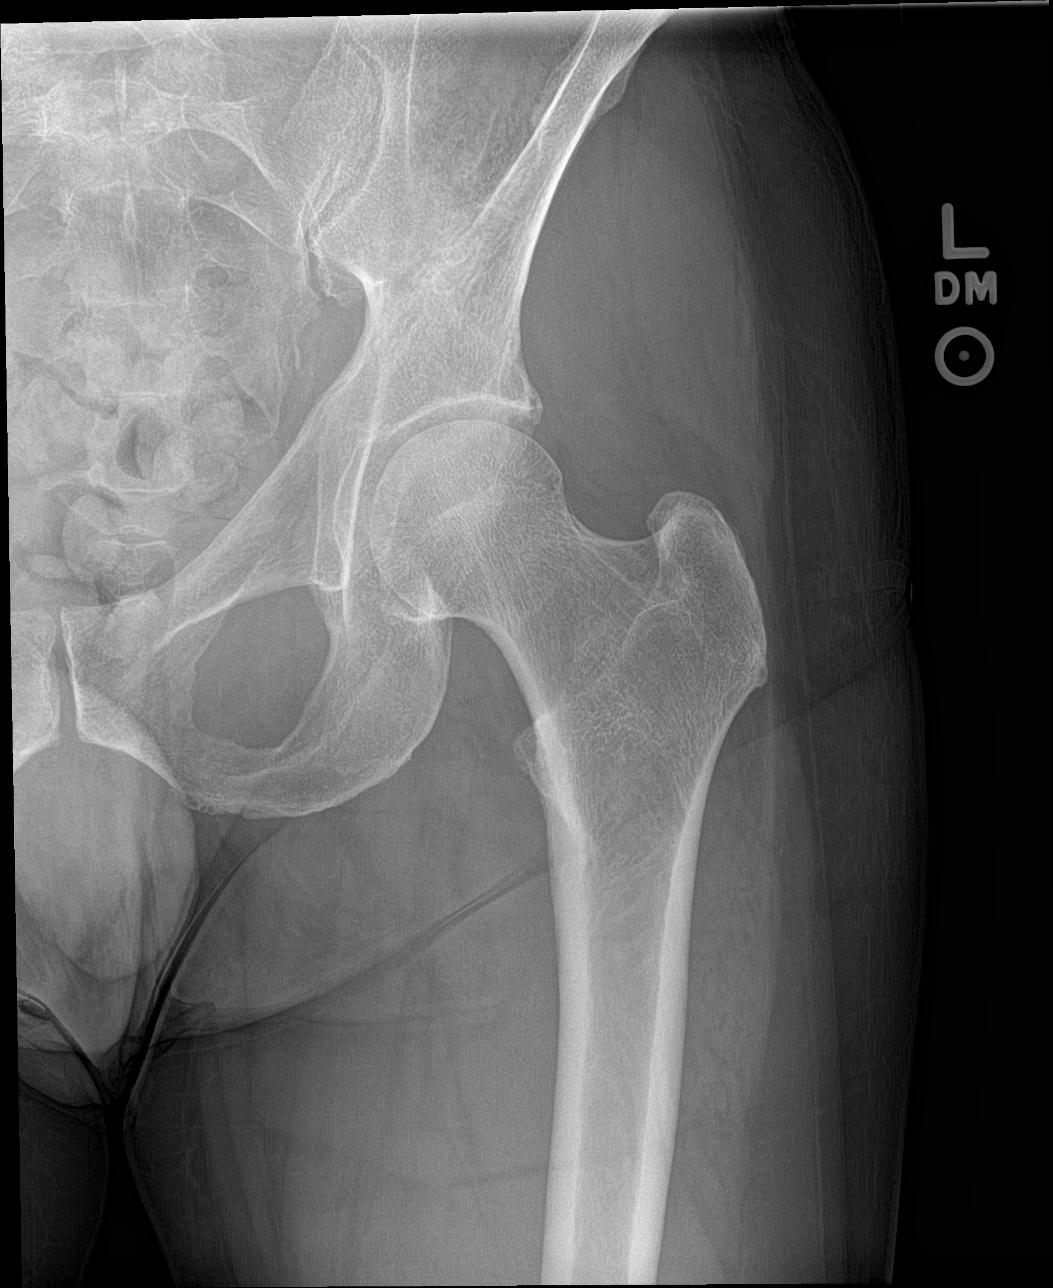

[hip lat]
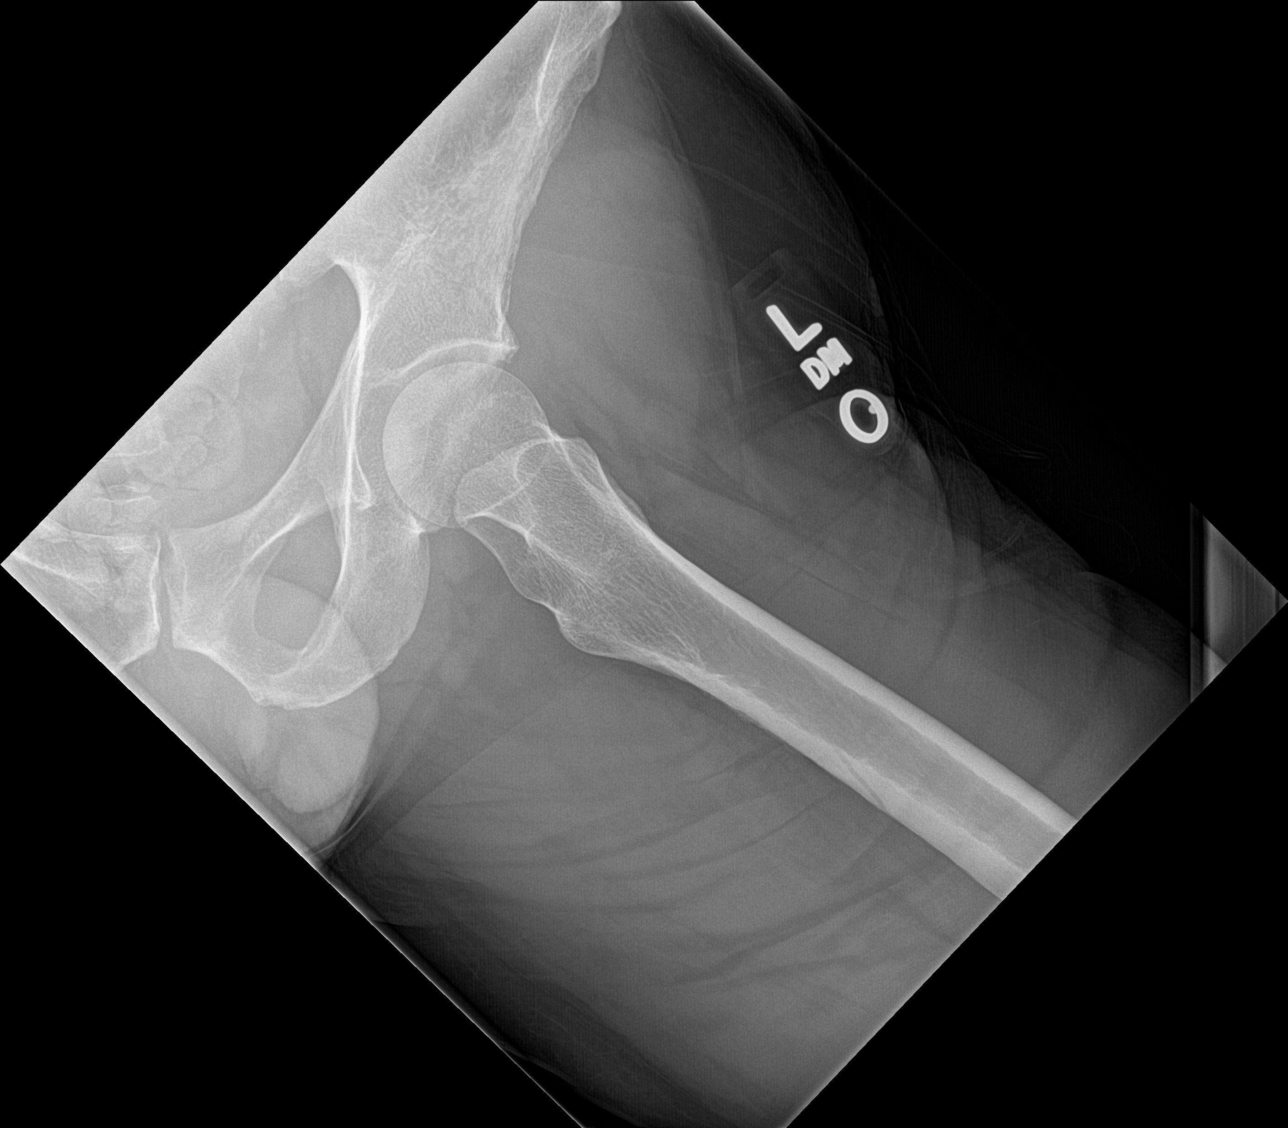

[3 of 3 positions shown; findings below may reference images not displayed]

FINDINGS: Destructive lytic lesion is noted within the right ischium
corresponding to the abnormality seen on prior PET CT. No left hip
abnormality. Previously seen sacral and left iliac lesions are not
well appreciated by plain films. No fracture, subluxation or
dislocation. Joint spaces are maintained.
IMPRESSION: Lytic destructive lesion in the right ischium corresponds to
abnormality on prior PET CT compatible with osseous metastatic
disease. Unable to visualize the sacral or left iliac lesions seen
on prior PET CT.

No acute bony abnormality within the left hip region.

## 2017-09-17 ENCOUNTER — Inpatient Hospital Stay: Payer: Commercial Managed Care - PPO

## 2017-09-17 ENCOUNTER — Other Ambulatory Visit: Payer: Self-pay

## 2017-09-17 ENCOUNTER — Inpatient Hospital Stay: Payer: Commercial Managed Care - PPO | Attending: Hematology & Oncology | Admitting: Hematology & Oncology

## 2017-09-17 ENCOUNTER — Encounter: Payer: Self-pay | Admitting: Hematology & Oncology

## 2017-09-17 VITALS — BP 143/94 | HR 75 | Temp 97.5°F | Resp 18 | Wt 158.0 lb

## 2017-09-17 DIAGNOSIS — Z9221 Personal history of antineoplastic chemotherapy: Secondary | ICD-10-CM | POA: Diagnosis not present

## 2017-09-17 DIAGNOSIS — Z79899 Other long term (current) drug therapy: Secondary | ICD-10-CM | POA: Diagnosis not present

## 2017-09-17 DIAGNOSIS — C8462 Anaplastic large cell lymphoma, ALK-positive, intrathoracic lymph nodes: Secondary | ICD-10-CM | POA: Diagnosis not present

## 2017-09-17 DIAGNOSIS — M79651 Pain in right thigh: Secondary | ICD-10-CM | POA: Diagnosis not present

## 2017-09-17 DIAGNOSIS — Z923 Personal history of irradiation: Secondary | ICD-10-CM | POA: Diagnosis not present

## 2017-09-17 DIAGNOSIS — Z7982 Long term (current) use of aspirin: Secondary | ICD-10-CM | POA: Diagnosis not present

## 2017-09-17 LAB — CMP (CANCER CENTER ONLY)
ALK PHOS: 71 U/L (ref 40–150)
ALT: 19 U/L (ref 0–55)
AST: 25 U/L (ref 5–34)
Albumin: 4 g/dL (ref 3.5–5.0)
Anion gap: 9 (ref 3–11)
BUN: 17 mg/dL (ref 7–26)
CALCIUM: 8.9 mg/dL (ref 8.4–10.4)
CO2: 21 mmol/L — AB (ref 22–29)
CREATININE: 0.96 mg/dL (ref 0.70–1.30)
Chloride: 106 mmol/L (ref 98–109)
GFR, Estimated: >60 mL/min (ref >60)
Glucose, Bld: 100 mg/dL (ref 70–140)
Potassium: 4.2 mmol/L (ref 3.5–5.1)
SODIUM: 136 mmol/L (ref 136–145)
Total Bilirubin: 0.4 mg/dL (ref 0.2–1.2)
Total Protein: 6.7 g/dL (ref 6.4–8.3)

## 2017-09-17 LAB — CBC WITH DIFFERENTIAL (CANCER CENTER ONLY)
Basophils Absolute: 0 10*3/uL (ref 0.0–0.1)
Basophils Relative: 0 %
Eosinophils Absolute: 0.2 10*3/uL (ref 0.0–0.5)
Eosinophils Relative: 4 %
HCT: 45.9 % (ref 38.7–49.9)
HEMOGLOBIN: 16.1 g/dL (ref 13.0–17.1)
LYMPHS ABS: 1.3 10*3/uL (ref 0.9–3.3)
LYMPHS PCT: 28 %
MCH: 31.4 pg (ref 28.0–33.4)
MCHC: 35.1 g/dL (ref 32.0–35.9)
MCV: 89.6 fL (ref 82.0–98.0)
Monocytes Absolute: 0.4 10*3/uL (ref 0.1–0.9)
Monocytes Relative: 9 %
NEUTROS PCT: 59 %
Neutro Abs: 2.8 10*3/uL (ref 1.5–6.5)
Platelet Count: 179 10*3/uL (ref 145–400)
RBC: 5.12 MIL/uL (ref 4.20–5.70)
RDW: 13.1 % (ref 11.1–15.7)
WBC Count: 4.6 10*3/uL (ref 4.0–10.0)

## 2017-09-17 LAB — LACTATE DEHYDROGENASE: LDH: 167 U/L (ref 125–245)

## 2017-09-17 NOTE — Progress Notes (Signed)
Hematology and Oncology Follow Up Visit  Logan Mendoza [id number] June 15, 1950 67 y.o. 09/17/2017   Principle Diagnosis:   Anaplastic large cell non-Hodgkin's lymphoma-ALK (+)  Current Therapy:    Status post cycle 8 of CHOP - completed in 06/06/2015  S/p XRT to the right pelvis-completed 09/27/2015     Interim History:  Logan Mendoza is back for a follow-up.  We saw him 6 months ago.  He is in need of glaucoma and cataract surgery.  He will probably have these done either later this month or in July.  He is worried about his right thigh.  It began to hurt him again.  It is not all the time.  It is intermittent.  It seems to be worse when he lies down.  He says this is how his lymphoma showed up.  We will need to get a PET scan to see exactly what is going on.   He is still working.  He has had no problems with work.  There is been no nausea or vomiting.  He has had no change in bowel or bladder habits.  He has had no rashes.  He has had no leg swelling.  He has not noted any weakness in his legs.  Overall, his performance status is ECOG 0.    Medications:  Current Outpatient Medications:  .  aspirin EC 81 MG tablet, Take 81 mg by mouth., Disp: , Rfl:  .  Cholecalciferol (VITAMIN D-1000 MAX ST) 1000 UNITS tablet, Take 2,000 Units by mouth 3 (three) times daily. , Disp: , Rfl:  .  co-enzyme Q-10 30 MG capsule, Take 200 mg by mouth daily. , Disp: , Rfl:  .  finasteride (PROSCAR) 5 MG tablet, Take 5 mg by mouth daily., Disp: , Rfl:  .  GLUCOSAMINE HCL PO, Take 1 tablet by mouth daily. , Disp: , Rfl:  .  Milk Thistle 140 MG CAPS, Take 250 mg by mouth daily. , Disp: , Rfl:  .  NIFEdipine (PROCARDIA-XL/ADALAT-CC/NIFEDICAL-XL) 30 MG 24 hr tablet, Take 30 mg by mouth., Disp: , Rfl:  .  Plant Sterols and Stanols (CHOLEST OFF PO), Take 300 mg by mouth daily. , Disp: , Rfl:  .  Pyridoxine HCl (VITAMIN B-6) 250 MG tablet, Take 1 tablet (250 mg total) by mouth daily., Disp: 90 tablet, Rfl:  3 .  Red Yeast Rice Extract (RED YEAST RICE PO), Take 600 mg by mouth 2 (two) times daily. , Disp: , Rfl:  .  saw palmetto 500 MG capsule, Take 450 mg by mouth daily., Disp: , Rfl:  .  testosterone enanthate (DELATESTRYL) 200 MG/ML injection, Inject into the muscle every 14 (fourteen) days. 4 ml every 2 weeks, Disp: , Rfl:  .  timolol (TIMOPTIC) 0.5 % ophthalmic solution, , Disp: , Rfl:   Allergies:  Allergies  Allergen Reactions  . Sulfa Antibiotics Hives  . Sucralfate   . Sulfa Drugs Cross Reactors Itching and Rash  . Sulfamethoxazole-Trimethoprim Rash    Past Medical History, Surgical history, Social history, and Family History were reviewed and updated.  Review of Systems: Review of Systems  Constitutional: Negative.   HENT: Negative.   Eyes: Positive for blurred vision and pain.  Respiratory: Negative.   Cardiovascular: Negative.   Gastrointestinal: Negative.   Genitourinary: Negative.   Musculoskeletal: Positive for joint pain and myalgias.  Skin: Negative.   Neurological: Negative.   Endo/Heme/Allergies: Negative.   Psychiatric/Behavioral: Negative.      Physical Exam:  weight is 158  lb (71.7 kg). His oral temperature is 97.5 F (36.4 C) (abnormal). His blood pressure is 143/94 (abnormal) and his pulse is 75. His respiration is 18 and oxygen saturation is 98%.   Wt Readings from Last 3 Encounters:  09/17/17 158 lb (71.7 kg)  03/19/17 161 lb (73 kg)  05/13/16 160 lb 4 oz (72.7 kg)     Physical Exam  Constitutional: He is oriented to person, place, and time.  HENT:  Head: Normocephalic and atraumatic.  Mouth/Throat: Oropharynx is clear and moist.  Eyes: Pupils are equal, round, and reactive to light. EOM are normal.  Neck: Normal range of motion.  Cardiovascular: Normal rate, regular rhythm and normal heart sounds.  Pulmonary/Chest: Effort normal and breath sounds normal.  Abdominal: Soft. Bowel sounds are normal.  Musculoskeletal: Normal range of motion.  He exhibits no edema, tenderness or deformity.  Lymphadenopathy:    He has no cervical adenopathy.  Neurological: He is alert and oriented to person, place, and time.  Skin: Skin is warm and dry. No rash noted. No erythema.  Psychiatric: He has a normal mood and affect. His behavior is normal. Judgment and thought content normal.  Vitals reviewed.    Lab Results  Component Value Date   WBC 4.6 09/17/2017   HGB 16.1 09/17/2017   HCT 45.9 09/17/2017   MCV 89.6 09/17/2017   PLT 179 09/17/2017     Chemistry      Component Value Date/Time   NA 140 03/19/2017 0809   NA 137 09/11/2016 0822   K 4.0 03/19/2017 0809   K 4.1 09/11/2016 0822   CL 103 03/19/2017 0809   CO2 25 03/19/2017 0809   CO2 22 09/11/2016 0822   BUN 16 03/19/2017 0809   BUN 13.6 09/11/2016 0822   CREATININE 1.0 03/19/2017 0809   CREATININE 0.9 09/11/2016 0822      Component Value Date/Time   CALCIUM 9.0 03/19/2017 0809   CALCIUM 9.0 09/11/2016 0822   ALKPHOS 69 03/19/2017 0809   ALKPHOS 76 09/11/2016 0822   AST 24 03/19/2017 0809   AST 25 09/11/2016 0822   ALT 32 03/19/2017 0809   ALT 24 09/11/2016 0822   BILITOT 0.90 03/19/2017 0809   BILITOT 0.48 09/11/2016 0822         Impression and Plan: Logan Mendoza is a 67 year old white male. He has anaplastic large cell non-Hodgkin's lymphoma. 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  Because he has a T-cell lymphoma, he is clearly at a higher risk for recurrence.  We will get a PET scan on him to see what is going on.  I really hope that this is nothing related to his having lymphoma.  He is now out from treatment by over 2 years.  He completed treatment back in February 2017.  We will still see him back every 6 months.  If there is a problem on the PET scan, then we will definitely get him back sooner.  From my perspective, I do not see a problem with him having surgery for his eyes. Volanda Napoleon, MD 6/7/20198:43 AM

## 2017-09-18 ENCOUNTER — Encounter: Payer: Self-pay | Admitting: Hematology & Oncology

## 2017-09-22 ENCOUNTER — Encounter: Payer: Self-pay | Admitting: Hematology & Oncology

## 2017-09-24 ENCOUNTER — Ambulatory Visit (HOSPITAL_COMMUNITY): Payer: Commercial Managed Care - PPO

## 2017-12-27 IMAGING — PT NM PET TUM IMG RESTAG (PS) SKULL BASE T - THIGH
8 series · 20 of 25 positions shown · non-contrast
Comparison: 04/19/2015 PET-CT.

CLINICAL DATA: Subsequent treatment strategy for non-Hodgkin's
(anaplastic large cell) lymphoma status post chemotherapy.

EXAM:
NUCLEAR MEDICINE PET SKULL BASE TO THIGH
TECHNIQUE: 7.1 mCi F-18 FDG was injected intravenously. Full-ring PET imaging
was performed from the skull base to thigh after the radiotracer. CT
data was obtained and used for attenuation correction and anatomic
localization.
FASTING BLOOD GLUCOSE:  Value: 105 mg/dl

[Series 3: pet sk_thigh ac · axial · 5.0mm · 4.07mm/px · z∈[-1002,-154]mm · 3 of 213 slices shown]
[im 1/213]
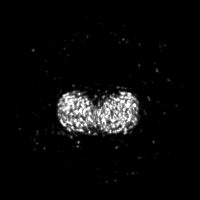
[im 71/213]
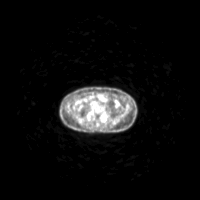
[im 213/213]
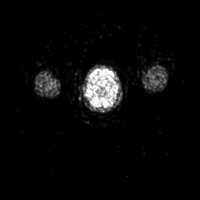

[Series 4: ct sk_thigh 5.0 b31f · axial · 5.0mm · 0.98mm/px · z∈[-1002,-154]mm · 4 of 213 slices shown]
[im 1/213]
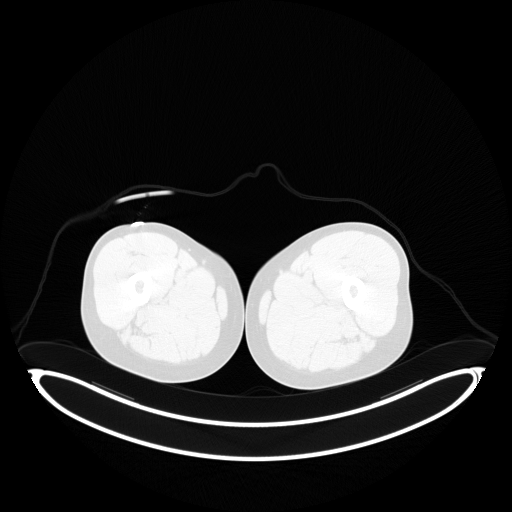
[im 54/213]
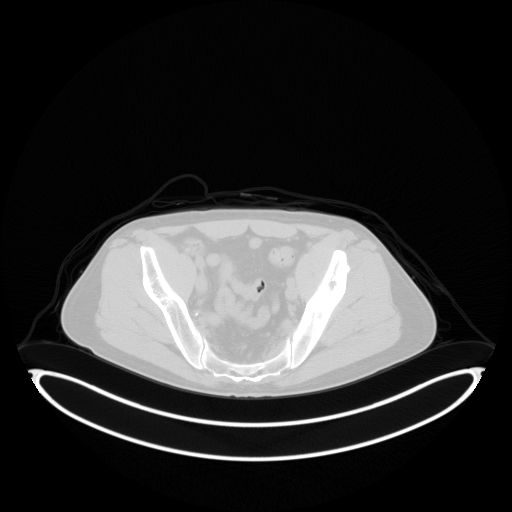
[im 107/213]
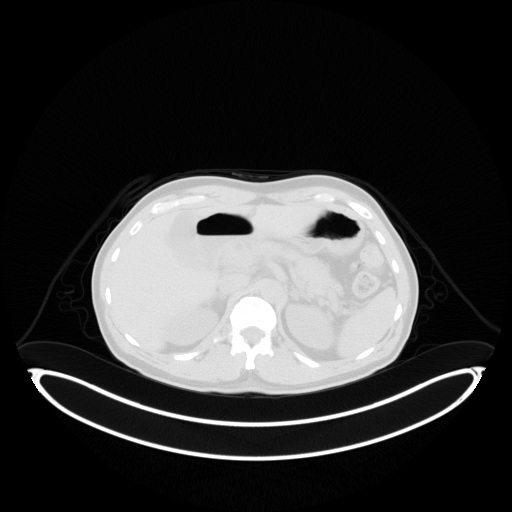
[im 213/213  brain]
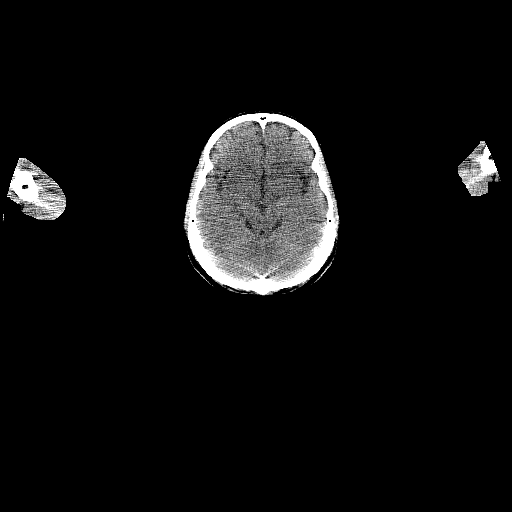

[Series 6: ct sk_thigh 5.0 b70f (id)_bone · axial · 5.0mm · 0.62mm/px · z∈[-570,-286]mm · 2 of 72 slices shown]
[im 1/72  bone]
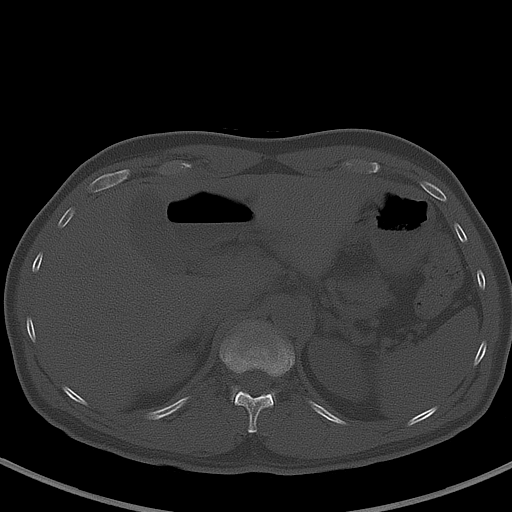
[im 72/72  bone]
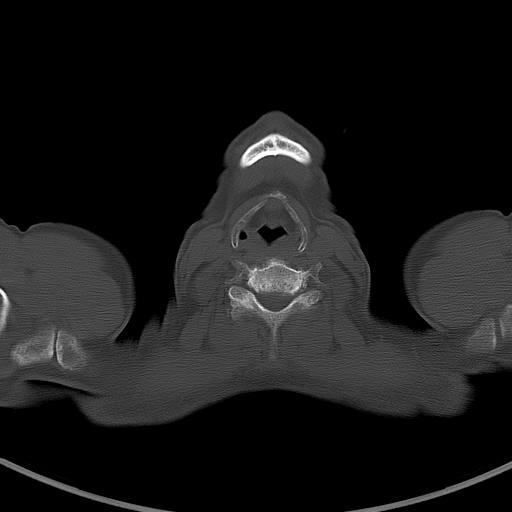

[Series 8: pet sk_thigh nac · axial · 5.0mm · 4.07mm/px · z∈[-1002,-154]mm · 4 of 213 slices shown]
[im 1/213]
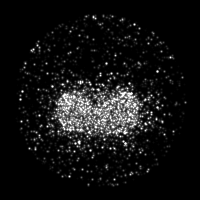
[im 107/213]
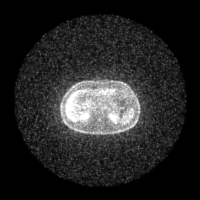
[im 160/213]
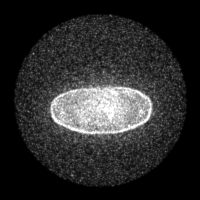
[im 213/213]
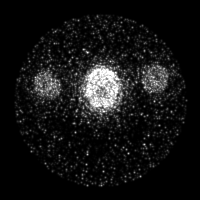

[Series 604: range-ct sk_thigh 5.0 (id)<alpha range> · 1 of 81 slices shown (1 of 2)]
[im 1/81]
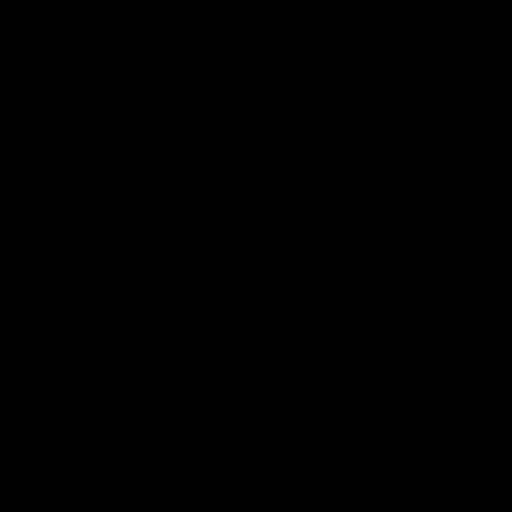

[Series 605: mip collection<mip range> · coronal · 1.76mm/px · 1 of 32 slices shown]
[im 1/32]
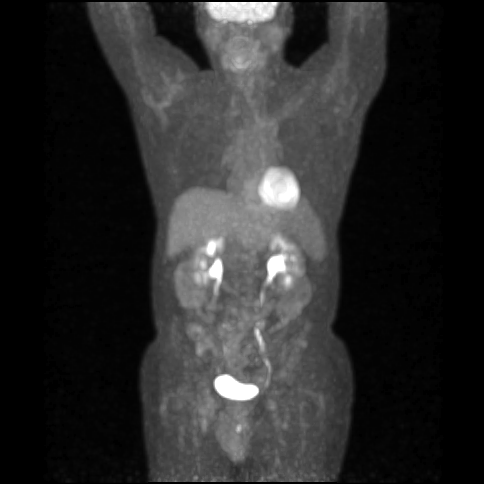

[Series 606: range-ct sk_thigh 5.0 (id)<alpha range> · 4 of 202 slices shown (2 of 2)]
[im 1/202]
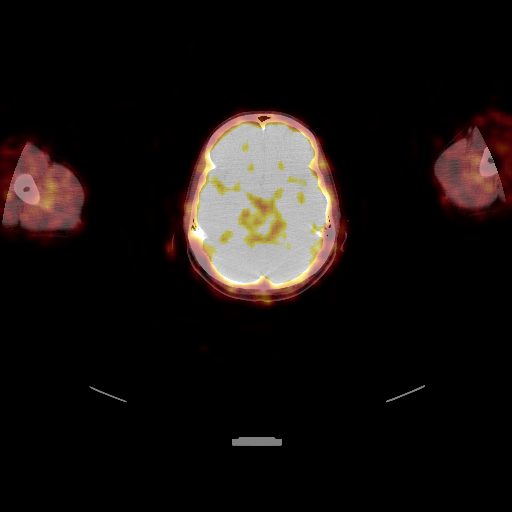
[im 51/202]
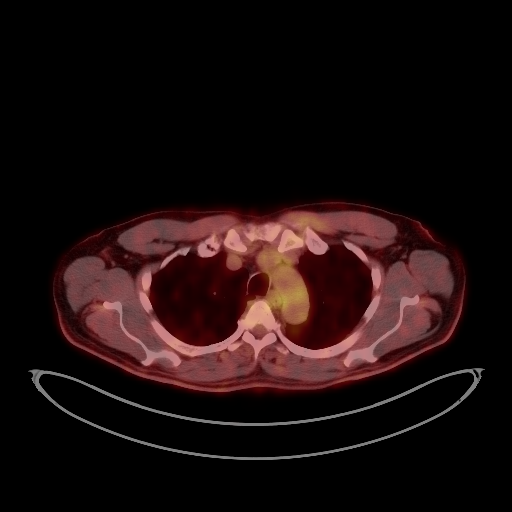
[im 101/202]
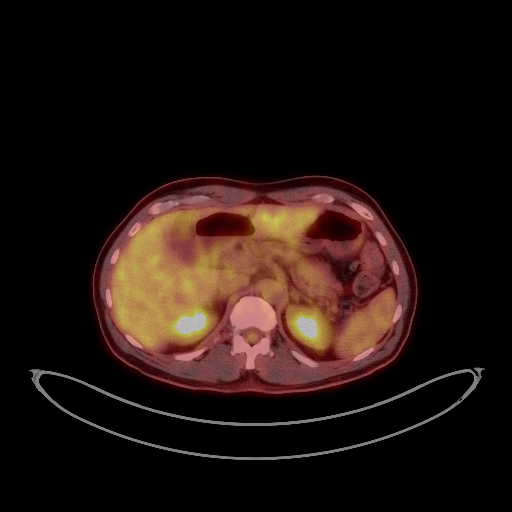
[im 202/202]
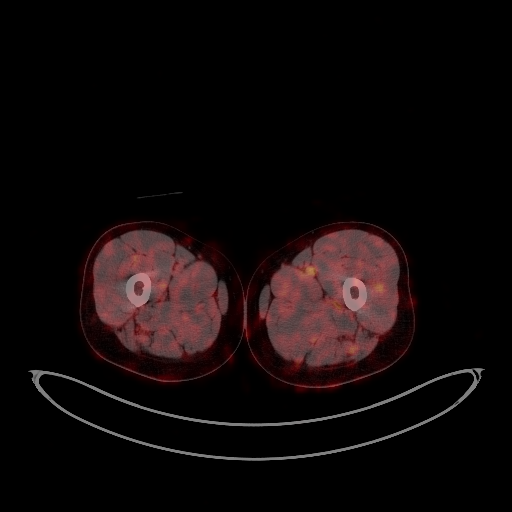

[Series 1396: results mm oncology reading · 0.50mm/px · 1 of 3 slices shown]
[im 1/3]
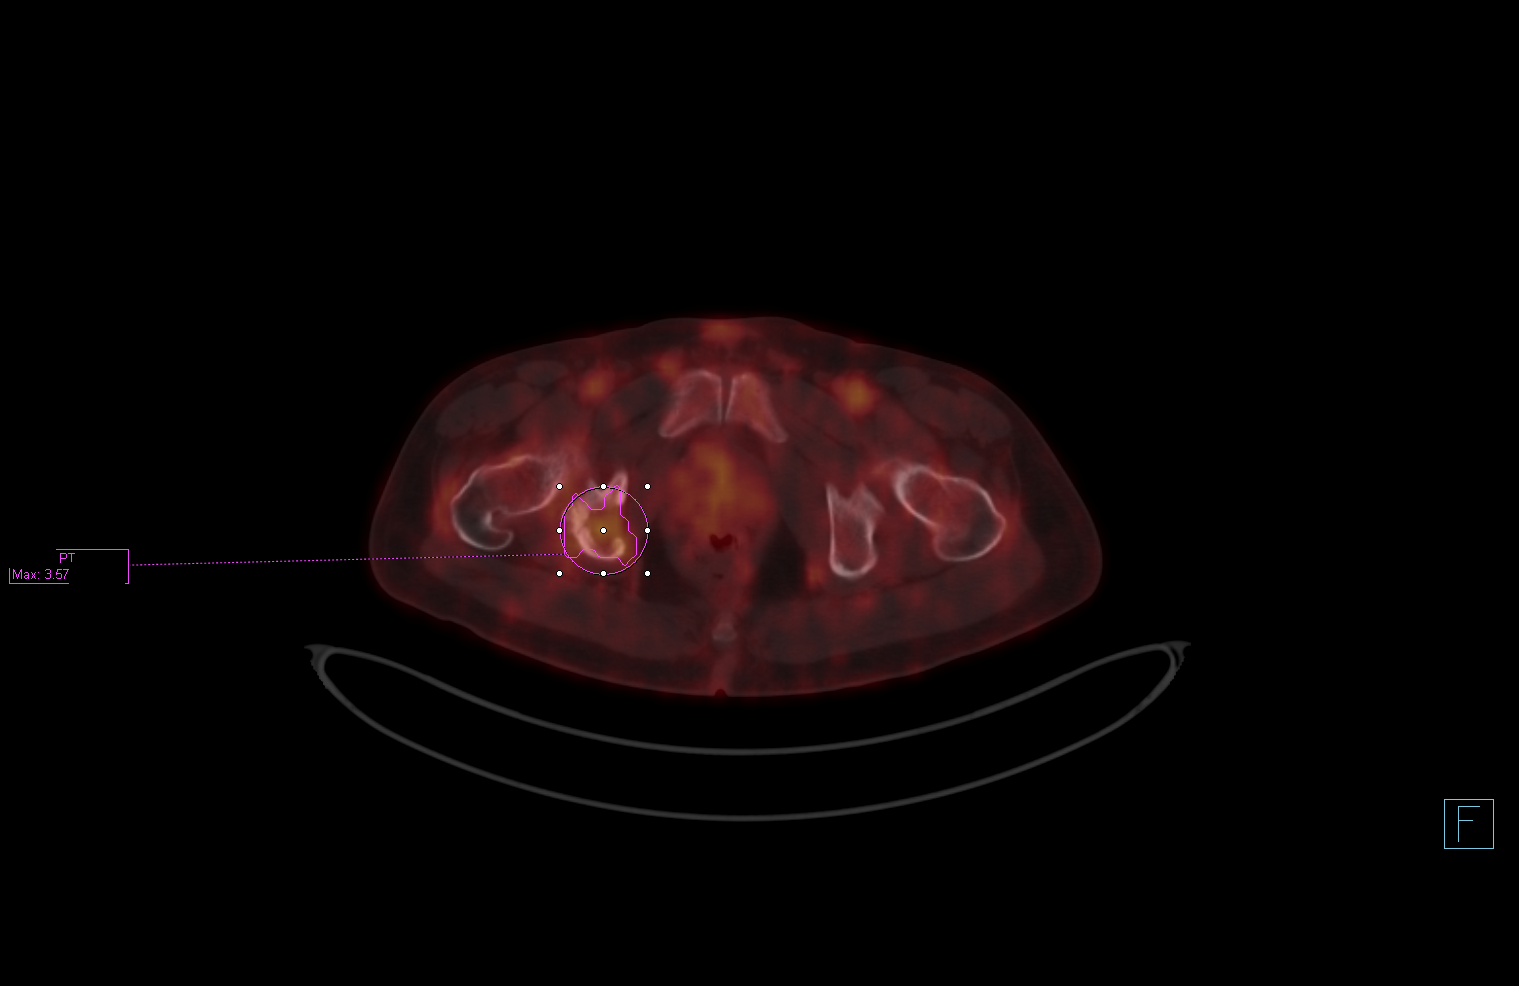

[20 of 25 positions shown; findings below may reference images not displayed]

FINDINGS: NECK

No hypermetabolic lymph nodes in the neck.

CHEST

No hypermetabolic axillary, mediastinal or hilar nodes.
Subcentimeter stable left upper lobe calcified granulomas. No acute
consolidative airspace disease or significant pulmonary nodules.
Coronary atherosclerosis.

ABDOMEN/PELVIS

No abnormal hypermetabolic activity within the liver, pancreas,
adrenal glands, or spleen. No hypermetabolic lymph nodes in the
abdomen or pelvis. Simple right renal cysts, largest 4.0 cm in the
anterior interpolar right kidney. Stable mild prostatomegaly.

SKELETON

Mildly expansile mixed lytic and sclerotic right ischial osseous
metastasis demonstrates mild hypermetabolism with max SUV 3.6,
previous max SUV 3.4, not appreciably changed. Mildly expansile
mixed lytic and sclerotic left iliac wing osseous metastasis
demonstrates minimal hypermetabolism with max SUV 2.8, previous max
SUV 4.3, decreased. Mildly expansile mixed lytic and sclerotic
midline upper sacral osseous metastasis demonstrates minimal
hypermetabolism with max SUV 3.0, previous max SUV 2.7, not
appreciably changed. No new hypermetabolic osseous lesions.
IMPRESSION: 1. Stable to decreased hypermetabolism within pelvic osseous
metastases. No new or progressive hypermetabolic metastatic disease.
2. Coronary atherosclerosis.

## 2018-03-18 ENCOUNTER — Other Ambulatory Visit: Payer: Self-pay

## 2018-03-18 ENCOUNTER — Inpatient Hospital Stay: Payer: Commercial Managed Care - PPO

## 2018-03-18 ENCOUNTER — Inpatient Hospital Stay: Payer: Commercial Managed Care - PPO | Attending: Hematology & Oncology | Admitting: Hematology & Oncology

## 2018-03-18 ENCOUNTER — Encounter: Payer: Self-pay | Admitting: Hematology & Oncology

## 2018-03-18 VITALS — BP 126/86 | HR 75 | Temp 97.5°F | Resp 17 | Wt 159.0 lb

## 2018-03-18 DIAGNOSIS — Z923 Personal history of irradiation: Secondary | ICD-10-CM | POA: Diagnosis not present

## 2018-03-18 DIAGNOSIS — M79651 Pain in right thigh: Secondary | ICD-10-CM | POA: Diagnosis not present

## 2018-03-18 DIAGNOSIS — C8462 Anaplastic large cell lymphoma, ALK-positive, intrathoracic lymph nodes: Secondary | ICD-10-CM | POA: Diagnosis present

## 2018-03-18 DIAGNOSIS — Z7982 Long term (current) use of aspirin: Secondary | ICD-10-CM | POA: Insufficient documentation

## 2018-03-18 DIAGNOSIS — Z79899 Other long term (current) drug therapy: Secondary | ICD-10-CM | POA: Insufficient documentation

## 2018-03-18 LAB — CBC WITH DIFFERENTIAL (CANCER CENTER ONLY)
Abs Immature Granulocytes: 0.05 10*3/uL (ref 0.00–0.07)
BASOS ABS: 0 10*3/uL (ref 0.0–0.1)
Basophils Relative: 1 %
EOS ABS: 0.1 10*3/uL (ref 0.0–0.5)
Eosinophils Relative: 2 %
HEMATOCRIT: 47.6 % (ref 39.0–52.0)
Hemoglobin: 16.3 g/dL (ref 13.0–17.0)
IMMATURE GRANULOCYTES: 1 %
LYMPHS ABS: 1.4 10*3/uL (ref 0.7–4.0)
LYMPHS PCT: 29 %
MCH: 31 pg (ref 26.0–34.0)
MCHC: 34.2 g/dL (ref 30.0–36.0)
MCV: 90.5 fL (ref 80.0–100.0)
Monocytes Absolute: 0.4 10*3/uL (ref 0.1–1.0)
Monocytes Relative: 8 %
NEUTROS ABS: 2.9 10*3/uL (ref 1.7–7.7)
NEUTROS PCT: 59 %
Platelet Count: 207 10*3/uL (ref 150–400)
RBC: 5.26 MIL/uL (ref 4.22–5.81)
RDW: 11.9 % (ref 11.5–15.5)
WBC Count: 4.9 10*3/uL (ref 4.0–10.5)
nRBC: 0 % (ref 0.0–0.2)

## 2018-03-18 LAB — CMP (CANCER CENTER ONLY)
ALBUMIN: 4.2 g/dL (ref 3.5–5.0)
ALT: 26 U/L (ref 0–44)
AST: 23 U/L (ref 15–41)
Alkaline Phosphatase: 63 U/L (ref 38–126)
Anion gap: 9 (ref 5–15)
BILIRUBIN TOTAL: 0.6 mg/dL (ref 0.3–1.2)
BUN: 15 mg/dL (ref 8–23)
CHLORIDE: 104 mmol/L (ref 98–111)
CO2: 23 mmol/L (ref 22–32)
Calcium: 8.7 mg/dL — ABNORMAL LOW (ref 8.9–10.3)
Creatinine: 0.99 mg/dL (ref 0.61–1.24)
GFR, Est AFR Am: >60 mL/min (ref >60)
GFR, Estimated: >60 mL/min (ref >60)
GLUCOSE: 105 mg/dL — AB (ref 70–99)
POTASSIUM: 4 mmol/L (ref 3.5–5.1)
Sodium: 136 mmol/L (ref 135–145)
TOTAL PROTEIN: 6.4 g/dL — AB (ref 6.5–8.1)

## 2018-03-18 LAB — LACTATE DEHYDROGENASE: LDH: 149 U/L (ref 98–192)

## 2018-03-18 NOTE — Progress Notes (Signed)
Hematology and Oncology Follow Up Visit  Logan Mendoza [id number] 01-Jun-1950 67 y.o. 03/18/2018   Principle Diagnosis:   Anaplastic large cell non-Hodgkin's lymphoma-ALK (+)  Current Therapy:    Status post cycle 8 of CHOP - completed in 06/06/2015  S/p XRT to the right pelvis-completed 09/27/2015     Interim History:  Logan Mendoza is back for a follow-up.  We saw him 6 months ago.  He is doing quite well.  He actually has had cataract surgery.  He had lenses put in.  He does not need to wear any glasses.  He is still working.  He is thinking about retiring in 3 years.  He is looking forward to this.  He has had no problems with pain.  The last time I saw him, he was having some right thigh pain.  This seems to have gone away.  We did do a PET scan on him he is in need of glaucoma and cataract surgery.  He will probably have these done either later this month or in July.  He has had no problems with nausea or vomiting.  He has had no change in bowel or bladder habits.  Overall, his performance status is ECOG 0.    Medications:  Current Outpatient Medications:  .  aspirin EC 81 MG tablet, Take 81 mg by mouth., Disp: , Rfl:  .  Cholecalciferol (VITAMIN D-1000 MAX ST) 1000 UNITS tablet, Take 2,000 Units by mouth 3 (three) times daily. , Disp: , Rfl:  .  co-enzyme Q-10 30 MG capsule, Take 200 mg by mouth daily. , Disp: , Rfl:  .  finasteride (PROSCAR) 5 MG tablet, Take 5 mg by mouth daily., Disp: , Rfl:  .  GLUCOSAMINE HCL PO, Take 1 tablet by mouth daily. , Disp: , Rfl:  .  Milk Thistle 140 MG CAPS, Take 250 mg by mouth daily. , Disp: , Rfl:  .  NIFEdipine (PROCARDIA-XL/ADALAT-CC/NIFEDICAL-XL) 30 MG 24 hr tablet, Take 30 mg by mouth., Disp: , Rfl:  .  Plant Sterols and Stanols (CHOLEST OFF PO), Take 300 mg by mouth daily. , Disp: , Rfl:  .  Red Yeast Rice Extract (RED YEAST RICE PO), Take 600 mg by mouth 2 (two) times daily. , Disp: , Rfl:  .  saw palmetto 500 MG capsule, Take 450  mg by mouth daily., Disp: , Rfl:  .  testosterone enanthate (DELATESTRYL) 200 MG/ML injection, Inject into the muscle every 14 (fourteen) days. 4 ml every 2 weeks, Disp: , Rfl:  .  timolol (TIMOPTIC) 0.5 % ophthalmic solution, , Disp: , Rfl:   Allergies:  Allergies  Allergen Reactions  . Sulfa Antibiotics Hives  . Sucralfate   . Sulfa Drugs Cross Reactors Itching and Rash  . Sulfamethoxazole-Trimethoprim Rash    Past Medical History, Surgical history, Social history, and Family History were reviewed and updated.  Review of Systems: Review of Systems  Constitutional: Negative.   HENT: Negative.   Eyes: Positive for blurred vision and pain.  Respiratory: Negative.   Cardiovascular: Negative.   Gastrointestinal: Negative.   Genitourinary: Negative.   Musculoskeletal: Positive for joint pain and myalgias.  Skin: Negative.   Neurological: Negative.   Endo/Heme/Allergies: Negative.   Psychiatric/Behavioral: Negative.      Physical Exam:  weight is 159 lb (72.1 kg). His oral temperature is 97.5 F (36.4 C) (abnormal). His blood pressure is 126/86 and his pulse is 75. His respiration is 17 and oxygen saturation is 99%.   Wt  Readings from Last 3 Encounters:  03/18/18 159 lb (72.1 kg)  09/17/17 158 lb (71.7 kg)  03/19/17 161 lb (73 kg)     Physical Exam  Constitutional: He is oriented to person, place, and time.  HENT:  Head: Normocephalic and atraumatic.  Mouth/Throat: Oropharynx is clear and moist.  Eyes: Pupils are equal, round, and reactive to light. EOM are normal.  Neck: Normal range of motion.  Cardiovascular: Normal rate, regular rhythm and normal heart sounds.  Pulmonary/Chest: Effort normal and breath sounds normal.  Abdominal: Soft. Bowel sounds are normal.  Musculoskeletal: Normal range of motion. He exhibits no edema, tenderness or deformity.  Lymphadenopathy:    He has no cervical adenopathy.  Neurological: He is alert and oriented to person, place, and  time.  Skin: Skin is warm and dry. No rash noted. No erythema.  Psychiatric: He has a normal mood and affect. His behavior is normal. Judgment and thought content normal.  Vitals reviewed.    Lab Results  Component Value Date   WBC 4.6 09/17/2017   HGB 16.1 09/17/2017   HCT 45.9 09/17/2017   MCV 89.6 09/17/2017   PLT 179 09/17/2017     Chemistry      Component Value Date/Time   NA 136 09/17/2017 0759   NA 140 03/19/2017 0809   NA 137 09/11/2016 0822   K 4.2 09/17/2017 0759   K 4.0 03/19/2017 0809   K 4.1 09/11/2016 0822   CL 106 09/17/2017 0759   CL 103 03/19/2017 0809   CO2 21 (L) 09/17/2017 0759   CO2 25 03/19/2017 0809   CO2 22 09/11/2016 0822   BUN 17 09/17/2017 0759   BUN 16 03/19/2017 0809   BUN 13.6 09/11/2016 0822   CREATININE 0.96 09/17/2017 0759   CREATININE 1.0 03/19/2017 0809   CREATININE 0.9 09/11/2016 0822      Component Value Date/Time   CALCIUM 8.9 09/17/2017 0759   CALCIUM 9.0 03/19/2017 0809   CALCIUM 9.0 09/11/2016 0822   ALKPHOS 71 09/17/2017 0759   ALKPHOS 69 03/19/2017 0809   ALKPHOS 76 09/11/2016 0822   AST 25 09/17/2017 0759   AST 25 09/11/2016 0822   ALT 19 09/17/2017 0759   ALT 32 03/19/2017 0809   ALT 24 09/11/2016 0822   BILITOT 0.4 09/17/2017 0759   BILITOT 0.48 09/11/2016 0822         Impression and Plan: Logan Mendoza is a 21 pulse-year-old white male. He has anaplastic large cell non-Hodgkin's lymphoma. 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  His weight is because he has a T-cell lymphoma, he is clearly at a higher risk for recurrence.  I do not see an indication for any scans right now.  It is now been close to 3 years that he is completed treatment.  I will plan to get him back in 6 more months.  Again, I do not see that we have to do any scans on him.     Volanda Napoleon, MD 12/6/20198:10 AM

## 2018-03-19 LAB — BETA 2 MICROGLOBULIN, SERUM: BETA 2 MICROGLOBULIN: 1.2 mg/L (ref 0.6–2.4)

## 2018-09-23 ENCOUNTER — Inpatient Hospital Stay: Payer: Commercial Managed Care - PPO | Attending: Hematology & Oncology | Admitting: Hematology & Oncology

## 2018-09-23 ENCOUNTER — Inpatient Hospital Stay: Payer: Commercial Managed Care - PPO

## 2018-09-23 ENCOUNTER — Telehealth: Payer: Self-pay | Admitting: Hematology & Oncology

## 2018-09-23 ENCOUNTER — Encounter: Payer: Self-pay | Admitting: Hematology & Oncology

## 2018-09-23 ENCOUNTER — Other Ambulatory Visit: Payer: Self-pay

## 2018-09-23 VITALS — BP 125/80 | HR 74 | Temp 97.8°F | Resp 16 | Wt 155.0 lb

## 2018-09-23 DIAGNOSIS — Z8572 Personal history of non-Hodgkin lymphomas: Secondary | ICD-10-CM | POA: Diagnosis not present

## 2018-09-23 DIAGNOSIS — Z7982 Long term (current) use of aspirin: Secondary | ICD-10-CM | POA: Diagnosis not present

## 2018-09-23 DIAGNOSIS — Z923 Personal history of irradiation: Secondary | ICD-10-CM

## 2018-09-23 DIAGNOSIS — Z79899 Other long term (current) drug therapy: Secondary | ICD-10-CM | POA: Diagnosis not present

## 2018-09-23 DIAGNOSIS — Z9221 Personal history of antineoplastic chemotherapy: Secondary | ICD-10-CM | POA: Diagnosis not present

## 2018-09-23 DIAGNOSIS — C8462 Anaplastic large cell lymphoma, ALK-positive, intrathoracic lymph nodes: Secondary | ICD-10-CM

## 2018-09-23 LAB — CMP (CANCER CENTER ONLY)
ALT: 21 U/L (ref 0–44)
AST: 20 U/L (ref 15–41)
Albumin: 4.2 g/dL (ref 3.5–5.0)
Alkaline Phosphatase: 58 U/L (ref 38–126)
Anion gap: 8 (ref 5–15)
BUN: 19 mg/dL (ref 8–23)
CO2: 25 mmol/L (ref 22–32)
Calcium: 9.2 mg/dL (ref 8.9–10.3)
Chloride: 103 mmol/L (ref 98–111)
Creatinine: 0.93 mg/dL (ref 0.61–1.24)
GFR, Est AFR Am: >60 mL/min (ref >60)
GFR, Estimated: >60 mL/min (ref >60)
Glucose, Bld: 101 mg/dL — ABNORMAL HIGH (ref 70–99)
Potassium: 4 mmol/L (ref 3.5–5.1)
Sodium: 136 mmol/L (ref 135–145)
Total Bilirubin: 0.6 mg/dL (ref 0.3–1.2)
Total Protein: 6.6 g/dL (ref 6.5–8.1)

## 2018-09-23 LAB — CBC WITH DIFFERENTIAL (CANCER CENTER ONLY)
Abs Immature Granulocytes: 0.03 10*3/uL (ref 0.00–0.07)
Basophils Absolute: 0 10*3/uL (ref 0.0–0.1)
Basophils Relative: 0 %
Eosinophils Absolute: 0.1 10*3/uL (ref 0.0–0.5)
Eosinophils Relative: 2 %
HCT: 46.9 % (ref 39.0–52.0)
Hemoglobin: 16.3 g/dL (ref 13.0–17.0)
Immature Granulocytes: 1 %
Lymphocytes Relative: 25 %
Lymphs Abs: 1.4 10*3/uL (ref 0.7–4.0)
MCH: 31.7 pg (ref 26.0–34.0)
MCHC: 34.8 g/dL (ref 30.0–36.0)
MCV: 91.2 fL (ref 80.0–100.0)
Monocytes Absolute: 0.4 10*3/uL (ref 0.1–1.0)
Monocytes Relative: 7 %
Neutro Abs: 3.7 10*3/uL (ref 1.7–7.7)
Neutrophils Relative %: 65 %
Platelet Count: 224 10*3/uL (ref 150–400)
RBC: 5.14 MIL/uL (ref 4.22–5.81)
RDW: 12.4 % (ref 11.5–15.5)
WBC Count: 5.7 10*3/uL (ref 4.0–10.5)
nRBC: 0 % (ref 0.0–0.2)

## 2018-09-23 LAB — LACTATE DEHYDROGENASE: LDH: 152 U/L (ref 98–192)

## 2018-09-23 NOTE — Telephone Encounter (Signed)
lmom for 12/11 appt at [facility] per 6/12 los

## 2018-09-23 NOTE — Progress Notes (Signed)
Hematology and Oncology Follow Up Visit  Logan Mendoza [id number] Aug 16, 1950 68 y.o. 09/23/2018   Principle Diagnosis:   Anaplastic large cell non-Hodgkin's lymphoma-ALK (+)  Current Therapy:    Status post cycle 8 of CHOP - completed in 06/06/2015  S/p XRT to the right pelvis-completed 09/27/2015     Interim History:  Logan Mendoza is back for a follow-up.  We saw him 6 months ago.  He is doing pretty well.  He is quite busy with work.  He is working at home.  Sounds like he will not go back to the office until the new year.  He has been walking.  His gym has been closed has not been able to go to the gym.  He has had no problems with bony pain.  He has had no change in bowel or bladder habits.  He has had no bleeding.  There is been no cough shortness of breath.    Unfortunately, because of his has metastatic colon cancer.  He is providing transportation to Logan Mendoza.  She is getting treated at [facility]..  Overall, his performance status is ECOG 0.    Medications:  Current Outpatient Medications:  .  aspirin EC 81 MG tablet, Take 81 mg by mouth., Disp: , Rfl:  .  Cholecalciferol (VITAMIN D-1000 MAX ST) 1000 UNITS tablet, Take 2,000 Units by mouth 3 (three) times daily. , Disp: , Rfl:  .  co-enzyme Q-10 30 MG capsule, Take 200 mg by mouth daily. , Disp: , Rfl:  .  finasteride (PROSCAR) 5 MG tablet, Take 5 mg by mouth daily., Disp: , Rfl:  .  GLUCOSAMINE HCL PO, Take 1 tablet by mouth daily. , Disp: , Rfl:  .  Milk Thistle 140 MG CAPS, Take 250 mg by mouth daily. , Disp: , Rfl:  .  NIFEdipine (PROCARDIA-XL/ADALAT-CC/NIFEDICAL-XL) 30 MG 24 hr tablet, Take 30 mg by mouth., Disp: , Rfl:  .  Plant Sterols and Stanols (CHOLEST OFF PO), Take 300 mg by mouth daily. , Disp: , Rfl:  .  Red Yeast Rice Extract (RED YEAST RICE PO), Take 600 mg by mouth 2 (two) times daily. , Disp: , Rfl:  .  saw palmetto 500 MG capsule, Take 450 mg by mouth daily., Disp: , Rfl:  .  testosterone cypionate  (DEPOTESTOTERONE CYPIONATE) 100 MG/ML injection, Inject 100 mg into the muscle every 14 (fourteen) days., Disp: , Rfl:  .  testosterone enanthate (DELATESTRYL) 200 MG/ML injection, Inject into the muscle every 14 (fourteen) days. 4 ml every 2 weeks, Disp: , Rfl:   Allergies:  Allergies  Allergen Reactions  . Sulfa Antibiotics Hives  . Sucralfate   . Sulfa Drugs Cross Reactors Itching and Rash  . Sulfamethoxazole-Trimethoprim Rash    Past Medical History, Surgical history, Social history, and Family History were reviewed and updated.  Review of Systems: Review of Systems  Constitutional: Negative.   HENT: Negative.   Eyes: Positive for blurred vision and pain.  Respiratory: Negative.   Cardiovascular: Negative.   Gastrointestinal: Negative.   Genitourinary: Negative.   Musculoskeletal: Positive for joint pain and myalgias.  Skin: Negative.   Neurological: Negative.   Endo/Heme/Allergies: Negative.   Psychiatric/Behavioral: Negative.      Physical Exam:  weight is 155 lb (70.3 kg). His oral temperature is 97.8 F (36.6 C). His blood pressure is 125/80 and his pulse is 74. His respiration is 16 and oxygen saturation is 100%.   Wt Readings from Last 3 Encounters:  09/23/18 155  lb (70.3 kg)  03/18/18 159 lb (72.1 kg)  09/17/17 158 lb (71.7 kg)     Physical Exam Vitals signs reviewed.  HENT:     Head: Normocephalic and atraumatic.  Eyes:     Pupils: Pupils are equal, round, and reactive to light.  Neck:     Musculoskeletal: Normal range of motion.  Cardiovascular:     Rate and Rhythm: Normal rate and regular rhythm.     Heart sounds: Normal heart sounds.  Pulmonary:     Effort: Pulmonary effort is normal.     Breath sounds: Normal breath sounds.  Abdominal:     General: Bowel sounds are normal.     Palpations: Abdomen is soft.  Musculoskeletal: Normal range of motion.        General: No tenderness or deformity.  Lymphadenopathy:     Cervical: No cervical  adenopathy.  Skin:    General: Skin is warm and dry.     Findings: No erythema or rash.  Neurological:     Mental Status: He is alert and oriented to person, place, and time.  Psychiatric:        Behavior: Behavior normal.        Thought Content: Thought content normal.        Judgment: Judgment normal.      Lab Results  Component Value Date   WBC 5.7 09/23/2018   HGB 16.3 09/23/2018   HCT 46.9 09/23/2018   MCV 91.2 09/23/2018   PLT 224 09/23/2018     Chemistry      Component Value Date/Time   NA 136 09/23/2018 0745   NA 140 03/19/2017 0809   NA 137 09/11/2016 0822   K 4.0 09/23/2018 0745   K 4.0 03/19/2017 0809   K 4.1 09/11/2016 0822   CL 103 09/23/2018 0745   CL 103 03/19/2017 0809   CO2 25 09/23/2018 0745   CO2 25 03/19/2017 0809   CO2 22 09/11/2016 0822   BUN 19 09/23/2018 0745   BUN 16 03/19/2017 0809   BUN 13.6 09/11/2016 0822   CREATININE 0.93 09/23/2018 0745   CREATININE 1.0 03/19/2017 0809   CREATININE 0.9 09/11/2016 0822      Component Value Date/Time   CALCIUM 9.2 09/23/2018 0745   CALCIUM 9.0 03/19/2017 0809   CALCIUM 9.0 09/11/2016 0822   ALKPHOS 58 09/23/2018 0745   ALKPHOS 69 03/19/2017 0809   ALKPHOS 76 09/11/2016 0822   AST 20 09/23/2018 0745   AST 25 09/11/2016 0822   ALT 21 09/23/2018 0745   ALT 32 03/19/2017 0809   ALT 24 09/11/2016 0822   BILITOT 0.6 09/23/2018 0745   BILITOT 0.48 09/11/2016 0822         Impression and Plan: Logan Mendoza is a 76 pulse-year-old white male. He has anaplastic large cell non-Hodgkin's lymphoma. 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  I do not see any evidence of recurrent disease.  I do not see an indication for any scans right now.  It is now been over 3 years that he is completed treatment.  I will plan to get him back in 6 more months.  Again, I do not see that we have to do any scans on him.     Volanda Napoleon, MD 6/12/20208:50 AM

## 2019-03-24 ENCOUNTER — Encounter: Payer: Self-pay | Admitting: Hematology & Oncology

## 2019-03-24 ENCOUNTER — Other Ambulatory Visit: Payer: Self-pay

## 2019-03-24 ENCOUNTER — Inpatient Hospital Stay: Payer: Commercial Managed Care - PPO

## 2019-03-24 ENCOUNTER — Inpatient Hospital Stay: Payer: Commercial Managed Care - PPO | Attending: Hematology & Oncology | Admitting: Hematology & Oncology

## 2019-03-24 VITALS — BP 138/87 | HR 90 | Temp 97.1°F | Resp 18 | Wt 158.5 lb

## 2019-03-24 DIAGNOSIS — Z9221 Personal history of antineoplastic chemotherapy: Secondary | ICD-10-CM | POA: Diagnosis not present

## 2019-03-24 DIAGNOSIS — Z7982 Long term (current) use of aspirin: Secondary | ICD-10-CM | POA: Diagnosis not present

## 2019-03-24 DIAGNOSIS — Z79899 Other long term (current) drug therapy: Secondary | ICD-10-CM | POA: Insufficient documentation

## 2019-03-24 DIAGNOSIS — C8462 Anaplastic large cell lymphoma, ALK-positive, intrathoracic lymph nodes: Secondary | ICD-10-CM | POA: Diagnosis not present

## 2019-03-24 DIAGNOSIS — Z923 Personal history of irradiation: Secondary | ICD-10-CM | POA: Insufficient documentation

## 2019-03-24 DIAGNOSIS — Z8572 Personal history of non-Hodgkin lymphomas: Secondary | ICD-10-CM | POA: Diagnosis not present

## 2019-03-24 LAB — CBC WITH DIFFERENTIAL (CANCER CENTER ONLY)
Abs Immature Granulocytes: 0.04 10*3/uL (ref 0.00–0.07)
Basophils Absolute: 0 10*3/uL (ref 0.0–0.1)
Basophils Relative: 1 %
Eosinophils Absolute: 0.1 10*3/uL (ref 0.0–0.5)
Eosinophils Relative: 2 %
HCT: 48.9 % (ref 39.0–52.0)
Hemoglobin: 17 g/dL (ref 13.0–17.0)
Immature Granulocytes: 1 %
Lymphocytes Relative: 29 %
Lymphs Abs: 1.9 10*3/uL (ref 0.7–4.0)
MCH: 31.2 pg (ref 26.0–34.0)
MCHC: 34.8 g/dL (ref 30.0–36.0)
MCV: 89.7 fL (ref 80.0–100.0)
Monocytes Absolute: 0.4 10*3/uL (ref 0.1–1.0)
Monocytes Relative: 7 %
Neutro Abs: 4 10*3/uL (ref 1.7–7.7)
Neutrophils Relative %: 60 %
Platelet Count: 259 10*3/uL (ref 150–400)
RBC: 5.45 MIL/uL (ref 4.22–5.81)
RDW: 12.7 % (ref 11.5–15.5)
WBC Count: 6.5 10*3/uL (ref 4.0–10.5)
nRBC: 0 % (ref 0.0–0.2)

## 2019-03-24 LAB — CMP (CANCER CENTER ONLY)
ALT: 21 U/L (ref 0–44)
AST: 20 U/L (ref 15–41)
Albumin: 4.5 g/dL (ref 3.5–5.0)
Alkaline Phosphatase: 60 U/L (ref 38–126)
Anion gap: 9 (ref 5–15)
BUN: 16 mg/dL (ref 8–23)
CO2: 25 mmol/L (ref 22–32)
Calcium: 9 mg/dL (ref 8.9–10.3)
Chloride: 103 mmol/L (ref 98–111)
Creatinine: 0.99 mg/dL (ref 0.61–1.24)
GFR, Est AFR Am: >60 mL/min (ref >60)
GFR, Estimated: >60 mL/min (ref >60)
Glucose, Bld: 112 mg/dL — ABNORMAL HIGH (ref 70–99)
Potassium: 3.9 mmol/L (ref 3.5–5.1)
Sodium: 137 mmol/L (ref 135–145)
Total Bilirubin: 0.5 mg/dL (ref 0.3–1.2)
Total Protein: 6.9 g/dL (ref 6.5–8.1)

## 2019-03-24 LAB — LACTATE DEHYDROGENASE: LDH: 150 U/L (ref 98–192)

## 2019-03-24 NOTE — Progress Notes (Signed)
Hematology and Oncology Follow Up Visit  Logan Mendoza [id number] 08-17-1950 68 y.o. 03/24/2019   Principle Diagnosis:   Anaplastic large cell non-Hodgkin's lymphoma-ALK (+)  Current Therapy:    Status post cycle 8 of CHOP - completed in 06/06/2015  S/p XRT to the right pelvis-completed 09/27/2015     Interim History:  Logan Mendoza is back for a follow-up.  We saw him 6 months ago.  So far, everything is going pretty well for him.  He is working from home now.  He seems to enjoy this.  He is trying to exercise.  He does walking.  He does go to the gym.  He has had no problems with nausea or vomiting.  There has been no cough or shortness of breath.  He has had no fever.  He has had no change in bowel or bladder habits.  There is been no issues with bony pain.  He has had no change in medications.  Overall, his performance status is ECOG 0.    Medications:  Current Outpatient Medications:  .  aspirin EC 81 MG tablet, Take 81 mg by mouth., Disp: , Rfl:  .  B-D 3CC LUER-LOK SYR 25GX1" 25G X 1" 3 ML MISC, See admin instructions., Disp: , Rfl:  .  Cholecalciferol (VITAMIN D-1000 MAX ST) 1000 UNITS tablet, Take 2,000 Units by mouth 3 (three) times daily. , Disp: , Rfl:  .  co-enzyme Q-10 30 MG capsule, Take 200 mg by mouth daily. , Disp: , Rfl:  .  finasteride (PROSCAR) 5 MG tablet, Take 5 mg by mouth daily., Disp: , Rfl:  .  GLUCOSAMINE HCL PO, Take 1 tablet by mouth daily. , Disp: , Rfl:  .  Milk Thistle 140 MG CAPS, Take 250 mg by mouth daily. , Disp: , Rfl:  .  NIFEdipine (PROCARDIA-XL/ADALAT-CC/NIFEDICAL-XL) 30 MG 24 hr tablet, Take 30 mg by mouth., Disp: , Rfl:  .  Plant Sterols and Stanols (CHOLEST OFF PO), Take 300 mg by mouth daily. , Disp: , Rfl:  .  Red Yeast Rice Extract (RED YEAST RICE PO), Take 600 mg by mouth 2 (two) times daily. , Disp: , Rfl:  .  saw palmetto 500 MG capsule, Take 450 mg by mouth daily., Disp: , Rfl:  .  testosterone cypionate (DEPOTESTOSTERONE  CYPIONATE) 200 MG/ML injection, SMARTSIG:0.4 Milliliter(s) IM Every 2 Weeks, Disp: , Rfl:  .  testosterone cypionate (DEPOTESTOTERONE CYPIONATE) 100 MG/ML injection, Inject 100 mg into the muscle every 14 (fourteen) days., Disp: , Rfl:  .  testosterone enanthate (DELATESTRYL) 200 MG/ML injection, Inject into the muscle every 14 (fourteen) days. 4 ml every 2 weeks, Disp: , Rfl:   Allergies:  Allergies  Allergen Reactions  . Sulfa Antibiotics Hives  . Sucralfate   . Sulfa Drugs Cross Reactors Itching and Rash  . Sulfamethoxazole-Trimethoprim Rash    Past Medical History, Surgical history, Social history, and Family History were reviewed and updated.  Review of Systems: Review of Systems  Constitutional: Negative.   HENT: Negative.   Eyes: Positive for blurred vision and pain.  Respiratory: Negative.   Cardiovascular: Negative.   Gastrointestinal: Negative.   Genitourinary: Negative.   Musculoskeletal: Positive for joint pain and myalgias.  Skin: Negative.   Neurological: Negative.   Endo/Heme/Allergies: Negative.   Psychiatric/Behavioral: Negative.      Physical Exam:  weight is 158 lb 8 oz (71.9 kg). His temporal temperature is 97.1 F (36.2 C) (abnormal). His blood pressure is 138/87 and his pulse  is 90. His respiration is 18 and oxygen saturation is 97%.   Wt Readings from Last 3 Encounters:  03/24/19 158 lb 8 oz (71.9 kg)  09/23/18 155 lb (70.3 kg)  03/18/18 159 lb (72.1 kg)     Physical Exam Vitals reviewed.  HENT:     Head: Normocephalic and atraumatic.  Eyes:     Pupils: Pupils are equal, round, and reactive to light.  Cardiovascular:     Rate and Rhythm: Normal rate and regular rhythm.     Heart sounds: Normal heart sounds.  Pulmonary:     Effort: Pulmonary effort is normal.     Breath sounds: Normal breath sounds.  Abdominal:     General: Bowel sounds are normal.     Palpations: Abdomen is soft.  Musculoskeletal:        General: No tenderness or  deformity. Normal range of motion.     Cervical back: Normal range of motion.  Lymphadenopathy:     Cervical: No cervical adenopathy.  Skin:    General: Skin is warm and dry.     Findings: No erythema or rash.  Neurological:     Mental Status: He is alert and oriented to person, place, and time.  Psychiatric:        Behavior: Behavior normal.        Thought Content: Thought content normal.        Judgment: Judgment normal.      Lab Results  Component Value Date   WBC 6.5 03/24/2019   HGB 17.0 03/24/2019   HCT 48.9 03/24/2019   MCV 89.7 03/24/2019   PLT 259 03/24/2019     Chemistry      Component Value Date/Time   NA 136 09/23/2018 0745   NA 140 03/19/2017 0809   NA 137 09/11/2016 0822   K 4.0 09/23/2018 0745   K 4.0 03/19/2017 0809   K 4.1 09/11/2016 0822   CL 103 09/23/2018 0745   CL 103 03/19/2017 0809   CO2 25 09/23/2018 0745   CO2 25 03/19/2017 0809   CO2 22 09/11/2016 0822   BUN 19 09/23/2018 0745   BUN 16 03/19/2017 0809   BUN 13.6 09/11/2016 0822   CREATININE 0.93 09/23/2018 0745   CREATININE 1.0 03/19/2017 0809   CREATININE 0.9 09/11/2016 0822      Component Value Date/Time   CALCIUM 9.2 09/23/2018 0745   CALCIUM 9.0 03/19/2017 0809   CALCIUM 9.0 09/11/2016 0822   ALKPHOS 58 09/23/2018 0745   ALKPHOS 69 03/19/2017 0809   ALKPHOS 76 09/11/2016 0822   AST 20 09/23/2018 0745   AST 25 09/11/2016 0822   ALT 21 09/23/2018 0745   ALT 32 03/19/2017 0809   ALT 24 09/11/2016 0822   BILITOT 0.6 09/23/2018 0745   BILITOT 0.48 09/11/2016 0822         Impression and Plan: Logan Mendoza is a 68 year-old white male. He has a history of anaplastic large cell non-Hodgkin's lymphoma. 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  I do not see any evidence of recurrent disease.  I do not see an indication for any scans right now.  It is now been close to 4 years that he is completed treatment.  I will plan to get him back in 6 more months.  Again, I do not  see that we have to do any scans on him.     Volanda Napoleon, MD 12/11/20208:15 AM

## 2019-08-09 ENCOUNTER — Encounter: Payer: Self-pay | Admitting: Hematology & Oncology

## 2019-09-22 ENCOUNTER — Inpatient Hospital Stay: Payer: Managed Care, Other (non HMO)

## 2019-09-22 ENCOUNTER — Inpatient Hospital Stay: Payer: Managed Care, Other (non HMO) | Attending: Hematology & Oncology | Admitting: Hematology & Oncology

## 2019-09-22 ENCOUNTER — Encounter: Payer: Self-pay | Admitting: Hematology & Oncology

## 2019-09-22 ENCOUNTER — Other Ambulatory Visit: Payer: Self-pay

## 2019-09-22 VITALS — BP 125/79 | HR 70 | Temp 96.9°F | Resp 18 | Ht 67.0 in | Wt 156.8 lb

## 2019-09-22 DIAGNOSIS — C8462 Anaplastic large cell lymphoma, ALK-positive, intrathoracic lymph nodes: Secondary | ICD-10-CM | POA: Diagnosis not present

## 2019-09-22 DIAGNOSIS — Z8572 Personal history of non-Hodgkin lymphomas: Secondary | ICD-10-CM | POA: Insufficient documentation

## 2019-09-22 DIAGNOSIS — Z9221 Personal history of antineoplastic chemotherapy: Secondary | ICD-10-CM | POA: Diagnosis not present

## 2019-09-22 DIAGNOSIS — Z79899 Other long term (current) drug therapy: Secondary | ICD-10-CM | POA: Insufficient documentation

## 2019-09-22 DIAGNOSIS — Z923 Personal history of irradiation: Secondary | ICD-10-CM | POA: Diagnosis not present

## 2019-09-22 DIAGNOSIS — Z7982 Long term (current) use of aspirin: Secondary | ICD-10-CM | POA: Diagnosis not present

## 2019-09-22 LAB — CBC WITH DIFFERENTIAL (CANCER CENTER ONLY)
Abs Immature Granulocytes: 0.03 10*3/uL (ref 0.00–0.07)
Basophils Absolute: 0 10*3/uL (ref 0.0–0.1)
Basophils Relative: 1 %
Eosinophils Absolute: 0.1 10*3/uL (ref 0.0–0.5)
Eosinophils Relative: 3 %
HCT: 49.8 % (ref 39.0–52.0)
Hemoglobin: 16.8 g/dL (ref 13.0–17.0)
Immature Granulocytes: 1 %
Lymphocytes Relative: 29 %
Lymphs Abs: 1.4 10*3/uL (ref 0.7–4.0)
MCH: 30.7 pg (ref 26.0–34.0)
MCHC: 33.7 g/dL (ref 30.0–36.0)
MCV: 90.9 fL (ref 80.0–100.0)
Monocytes Absolute: 0.3 10*3/uL (ref 0.1–1.0)
Monocytes Relative: 6 %
Neutro Abs: 2.9 10*3/uL (ref 1.7–7.7)
Neutrophils Relative %: 60 %
Platelet Count: 220 10*3/uL (ref 150–400)
RBC: 5.48 MIL/uL (ref 4.22–5.81)
RDW: 12.4 % (ref 11.5–15.5)
WBC Count: 4.7 10*3/uL (ref 4.0–10.5)
nRBC: 0 % (ref 0.0–0.2)

## 2019-09-22 LAB — CMP (CANCER CENTER ONLY)
ALT: 21 U/L (ref 0–44)
AST: 21 U/L (ref 15–41)
Albumin: 4.2 g/dL (ref 3.5–5.0)
Alkaline Phosphatase: 62 U/L (ref 38–126)
Anion gap: 6 (ref 5–15)
BUN: 19 mg/dL (ref 8–23)
CO2: 28 mmol/L (ref 22–32)
Calcium: 9.5 mg/dL (ref 8.9–10.3)
Chloride: 102 mmol/L (ref 98–111)
Creatinine: 1.05 mg/dL (ref 0.61–1.24)
GFR, Est AFR Am: >60 mL/min (ref >60)
GFR, Estimated: >60 mL/min (ref >60)
Glucose, Bld: 105 mg/dL — ABNORMAL HIGH (ref 70–99)
Potassium: 4.5 mmol/L (ref 3.5–5.1)
Sodium: 136 mmol/L (ref 135–145)
Total Bilirubin: 0.5 mg/dL (ref 0.3–1.2)
Total Protein: 6.7 g/dL (ref 6.5–8.1)

## 2019-09-22 LAB — LACTATE DEHYDROGENASE: LDH: 145 U/L (ref 98–192)

## 2019-09-22 NOTE — Progress Notes (Signed)
Hematology and Oncology Follow Up Visit  Logan Mendoza [id number] 1950/09/10 69 y.o. 09/22/2019   Principle Diagnosis:   Anaplastic large cell non-Hodgkin's lymphoma-ALK (+)  Current Therapy:    Status post cycle 8 of CHOP - completed in 06/06/2015  S/p XRT to the right pelvis-completed 09/27/2015     Interim History:  Logan Mendoza is back for a follow-up.  We saw him 6 months ago. Overall, he is done quite nicely. He is still working home. He may go back to the office this fall.  He has had no problems with the coronavirus. He has had his vaccinations.  He has had no bony pain. He is exercising. He looks quite active.  His appetite is good. He has had no nausea or vomiting. He has had no change in bowel or bladder habits.  He has had no rashes. There is been no bleeding. He has had no fever.  Overall, his performance status is ECOG 0.    Medications:  Current Outpatient Medications:  .  aspirin EC 81 MG tablet, Take 81 mg by mouth., Disp: , Rfl:  .  B-D 3CC LUER-LOK SYR 25GX1" 25G X 1" 3 ML MISC, See admin instructions., Disp: , Rfl:  .  Cholecalciferol (VITAMIN D-1000 MAX ST) 1000 UNITS tablet, Take 2,000 Units by mouth 3 (three) times daily. , Disp: , Rfl:  .  co-enzyme Q-10 30 MG capsule, Take 200 mg by mouth daily. , Disp: , Rfl:  .  finasteride (PROSCAR) 5 MG tablet, Take 5 mg by mouth daily., Disp: , Rfl:  .  GLUCOSAMINE HCL PO, Take 1 tablet by mouth daily. , Disp: , Rfl:  .  latanoprost (XALATAN) 0.005 % ophthalmic solution, Place 1 drop into both eyes at bedtime., Disp: , Rfl:  .  Milk Thistle 140 MG CAPS, Take 250 mg by mouth daily. , Disp: , Rfl:  .  NIFEdipine (PROCARDIA-XL/ADALAT-CC/NIFEDICAL-XL) 30 MG 24 hr tablet, Take 30 mg by mouth., Disp: , Rfl:  .  Plant Sterols and Stanols (CHOLEST OFF PO), Take 300 mg by mouth daily. , Disp: , Rfl:  .  Red Yeast Rice Extract (RED YEAST RICE PO), Take 600 mg by mouth 2 (two) times daily. , Disp: , Rfl:  .  saw palmetto 500  MG capsule, Take 450 mg by mouth daily., Disp: , Rfl:  .  testosterone cypionate (DEPOTESTOSTERONE CYPIONATE) 200 MG/ML injection, SMARTSIG:0.4 Milliliter(s) IM Every 2 Weeks, Disp: , Rfl:  .  testosterone cypionate (DEPOTESTOTERONE CYPIONATE) 100 MG/ML injection, Inject 100 mg into the muscle every 14 (fourteen) days., Disp: , Rfl:  .  testosterone enanthate (DELATESTRYL) 200 MG/ML injection, Inject into the muscle every 14 (fourteen) days. 4 ml every 2 weeks, Disp: , Rfl:   Allergies:  Allergies  Allergen Reactions  . Sulfa Antibiotics Hives  . Sucralfate Rash  . Sulfa Drugs Cross Reactors Itching and Rash  . Sulfamethoxazole-Trimethoprim Rash    Past Medical History, Surgical history, Social history, and Family History were reviewed and updated.  Review of Systems: Review of Systems  Constitutional: Negative.   HENT: Negative.   Eyes: Positive for blurred vision and pain.  Respiratory: Negative.   Cardiovascular: Negative.   Gastrointestinal: Negative.   Genitourinary: Negative.   Musculoskeletal: Positive for joint pain and myalgias.  Skin: Negative.   Neurological: Negative.   Endo/Heme/Allergies: Negative.   Psychiatric/Behavioral: Negative.      Physical Exam:  height is 5' 7"  (1.702 m) and weight is 156 lb 12.8 oz (  71.1 kg). His temporal temperature is 96.9 F (36.1 C) (abnormal). His blood pressure is 125/79 and his pulse is 70. His respiration is 18 and oxygen saturation is 100%.   Wt Readings from Last 3 Encounters:  09/22/19 156 lb 12.8 oz (71.1 kg)  03/24/19 158 lb 8 oz (71.9 kg)  09/23/18 155 lb (70.3 kg)     Physical Exam Vitals reviewed.  HENT:     Head: Normocephalic and atraumatic.  Eyes:     Pupils: Pupils are equal, round, and reactive to light.  Cardiovascular:     Rate and Rhythm: Normal rate and regular rhythm.     Heart sounds: Normal heart sounds.  Pulmonary:     Effort: Pulmonary effort is normal.     Breath sounds: Normal breath  sounds.  Abdominal:     General: Bowel sounds are normal.     Palpations: Abdomen is soft.  Musculoskeletal:        General: No tenderness or deformity. Normal range of motion.     Cervical back: Normal range of motion.  Lymphadenopathy:     Cervical: No cervical adenopathy.  Skin:    General: Skin is warm and dry.     Findings: No erythema or rash.  Neurological:     Mental Status: He is alert and oriented to person, place, and time.  Psychiatric:        Behavior: Behavior normal.        Thought Content: Thought content normal.        Judgment: Judgment normal.      Lab Results  Component Value Date   WBC 4.7 09/22/2019   HGB 16.8 09/22/2019   HCT 49.8 09/22/2019   MCV 90.9 09/22/2019   PLT 220 09/22/2019     Chemistry      Component Value Date/Time   NA 136 09/22/2019 0826   NA 140 03/19/2017 0809   NA 137 09/11/2016 0822   K 4.5 09/22/2019 0826   K 4.0 03/19/2017 0809   K 4.1 09/11/2016 0822   CL 102 09/22/2019 0826   CL 103 03/19/2017 0809   CO2 28 09/22/2019 0826   CO2 25 03/19/2017 0809   CO2 22 09/11/2016 0822   BUN 19 09/22/2019 0826   BUN 16 03/19/2017 0809   BUN 13.6 09/11/2016 0822   CREATININE 1.05 09/22/2019 0826   CREATININE 1.0 03/19/2017 0809   CREATININE 0.9 09/11/2016 0822      Component Value Date/Time   CALCIUM 9.5 09/22/2019 0826   CALCIUM 9.0 03/19/2017 0809   CALCIUM 9.0 09/11/2016 0822   ALKPHOS 62 09/22/2019 0826   ALKPHOS 69 03/19/2017 0809   ALKPHOS 76 09/11/2016 0822   AST 21 09/22/2019 0826   AST 25 09/11/2016 0822   ALT 21 09/22/2019 0826   ALT 32 03/19/2017 0809   ALT 24 09/11/2016 0822   BILITOT 0.5 09/22/2019 0826   BILITOT 0.48 09/11/2016 0822         Impression and Plan: Logan Mendoza is a 69 year-old white male. He has a history of anaplastic large cell non-Hodgkin's lymphoma. 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  I do not see any evidence of recurrent disease.  I do not see an indication for any  scans right now.  It is now been 4 years that he is completed treatment.  I will plan to get him back in 6 more months.  Again, I do not see that we have to do any  scans on him.     Volanda Napoleon, MD 6/11/20219:15 AM

## 2019-10-02 ENCOUNTER — Encounter: Payer: Self-pay | Admitting: Hematology & Oncology

## 2020-03-15 ENCOUNTER — Other Ambulatory Visit: Payer: Self-pay

## 2020-03-15 ENCOUNTER — Encounter: Payer: Self-pay | Admitting: Hematology & Oncology

## 2020-03-15 ENCOUNTER — Inpatient Hospital Stay: Payer: Managed Care, Other (non HMO)

## 2020-03-15 ENCOUNTER — Inpatient Hospital Stay: Payer: Managed Care, Other (non HMO) | Attending: Hematology & Oncology | Admitting: Hematology & Oncology

## 2020-03-15 VITALS — BP 119/81 | HR 86 | Temp 97.9°F | Resp 20 | Wt 155.0 lb

## 2020-03-15 DIAGNOSIS — C8462 Anaplastic large cell lymphoma, ALK-positive, intrathoracic lymph nodes: Secondary | ICD-10-CM

## 2020-03-15 DIAGNOSIS — Z923 Personal history of irradiation: Secondary | ICD-10-CM | POA: Diagnosis not present

## 2020-03-15 DIAGNOSIS — C8490 Mature T/NK-cell lymphomas, unspecified, unspecified site: Secondary | ICD-10-CM | POA: Insufficient documentation

## 2020-03-15 DIAGNOSIS — Z7982 Long term (current) use of aspirin: Secondary | ICD-10-CM | POA: Diagnosis not present

## 2020-03-15 LAB — CMP (CANCER CENTER ONLY)
ALT: 20 U/L (ref 0–44)
AST: 19 U/L (ref 15–41)
Albumin: 4.1 g/dL (ref 3.5–5.0)
Alkaline Phosphatase: 68 U/L (ref 38–126)
Anion gap: 7 (ref 5–15)
BUN: 21 mg/dL (ref 8–23)
CO2: 27 mmol/L (ref 22–32)
Calcium: 9.5 mg/dL (ref 8.9–10.3)
Chloride: 100 mmol/L (ref 98–111)
Creatinine: 1.19 mg/dL (ref 0.61–1.24)
GFR, Estimated: >60 mL/min (ref >60)
Glucose, Bld: 148 mg/dL — ABNORMAL HIGH (ref 70–99)
Potassium: 4.2 mmol/L (ref 3.5–5.1)
Sodium: 134 mmol/L — ABNORMAL LOW (ref 135–145)
Total Bilirubin: 0.5 mg/dL (ref 0.3–1.2)
Total Protein: 6.9 g/dL (ref 6.5–8.1)

## 2020-03-15 LAB — CBC WITH DIFFERENTIAL (CANCER CENTER ONLY)
Abs Immature Granulocytes: 0.04 10*3/uL (ref 0.00–0.07)
Basophils Absolute: 0 10*3/uL (ref 0.0–0.1)
Basophils Relative: 1 %
Eosinophils Absolute: 0.1 10*3/uL (ref 0.0–0.5)
Eosinophils Relative: 1 %
HCT: 49.3 % (ref 39.0–52.0)
Hemoglobin: 16.8 g/dL (ref 13.0–17.0)
Immature Granulocytes: 1 %
Lymphocytes Relative: 21 %
Lymphs Abs: 1.3 10*3/uL (ref 0.7–4.0)
MCH: 31.1 pg (ref 26.0–34.0)
MCHC: 34.1 g/dL (ref 30.0–36.0)
MCV: 91.1 fL (ref 80.0–100.0)
Monocytes Absolute: 0.3 10*3/uL (ref 0.1–1.0)
Monocytes Relative: 5 %
Neutro Abs: 4.5 10*3/uL (ref 1.7–7.7)
Neutrophils Relative %: 71 %
Platelet Count: 270 10*3/uL (ref 150–400)
RBC: 5.41 MIL/uL (ref 4.22–5.81)
RDW: 12.4 % (ref 11.5–15.5)
WBC Count: 6.2 10*3/uL (ref 4.0–10.5)
nRBC: 0 % (ref 0.0–0.2)

## 2020-03-15 LAB — LACTATE DEHYDROGENASE: LDH: 132 U/L (ref 98–192)

## 2020-03-15 NOTE — Progress Notes (Signed)
Hematology and Oncology Follow Up Visit  Logan Mendoza [id number] [birth year] 69 y.o. 03/15/2020   Principle Diagnosis:   Anaplastic large cell non-Hodgkin's lymphoma-ALK (+)  Current Therapy:    Status post cycle 8 of CHOP - completed in 06/06/2015  S/p XRT to the right pelvis-completed 09/27/2015     Interim History:  Logan Mendoza is back for a follow-up.  We saw him 6 months ago.  As always, he is doing well.  He is now working in the office.  He has adjusted back to this nicely.  He has had no problems healthwise.  He has been exercising.  He has been active.  He and his husband went to [place] to see family for Thanksgiving.  There has been no problems with fever.  He has had no cough or shortness of breath.  There has been no change in bowel or bladder habits.  He has had no leg swelling.  He has had no rashes.  He has had no headaches.  Overall, his performance status is ECOG 0.   Medications:  Current Outpatient Medications:  .  aspirin EC 81 MG tablet, Take 81 mg by mouth., Disp: , Rfl:  .  B-D 3CC LUER-LOK SYR 25GX1" 25G X 1" 3 ML MISC, See admin instructions., Disp: , Rfl:  .  Cholecalciferol (VITAMIN D-1000 MAX ST) 1000 UNITS tablet, Take 2,000 Units by mouth 3 (three) times daily. , Disp: , Rfl:  .  co-enzyme Q-10 30 MG capsule, Take 200 mg by mouth daily. , Disp: , Rfl:  .  finasteride (PROSCAR) 5 MG tablet, Take 5 mg by mouth daily., Disp: , Rfl:  .  GLUCOSAMINE HCL PO, Take 1 tablet by mouth daily. , Disp: , Rfl:  .  latanoprost (XALATAN) 0.005 % ophthalmic solution, Place 1 drop into both eyes at bedtime., Disp: , Rfl:  .  Milk Thistle 140 MG CAPS, Take 140 mg by mouth daily. , Disp: , Rfl:  .  NIFEdipine (PROCARDIA-XL/ADALAT-CC/NIFEDICAL-XL) 30 MG 24 hr tablet, Take 30 mg by mouth., Disp: , Rfl:  .  Plant Sterols and Stanols (CHOLEST OFF PO), Take 300 mg by mouth daily. , Disp: , Rfl:  .  Red Yeast Rice Extract (RED YEAST RICE PO), Take 600 mg by mouth 2 (two)  times daily. , Disp: , Rfl:  .  saw palmetto 500 MG capsule, Take 500 mg by mouth daily. , Disp: , Rfl:  .  testosterone cypionate (DEPOTESTOSTERONE CYPIONATE) 200 MG/ML injection, SMARTSIG:0.4 Milliliter(s) IM Every 2 Weeks, Disp: , Rfl:  .  testosterone cypionate (DEPOTESTOTERONE CYPIONATE) 100 MG/ML injection, Inject 100 mg into the muscle every 14 (fourteen) days., Disp: , Rfl:   Allergies:  Allergies  Allergen Reactions  . Sulfa Antibiotics Hives  . Sucralfate Rash  . Sulfa Drugs Cross Reactors Itching and Rash  . Sulfamethoxazole-Trimethoprim Rash    Past Medical History, Surgical history, Social history, and Family History were reviewed and updated.  Review of Systems: Review of Systems  Constitutional: Negative.   HENT: Negative.   Eyes: Positive for blurred vision and pain.  Respiratory: Negative.   Cardiovascular: Negative.   Gastrointestinal: Negative.   Genitourinary: Negative.   Musculoskeletal: Positive for joint pain and myalgias.  Skin: Negative.   Neurological: Negative.   Endo/Heme/Allergies: Negative.   Psychiatric/Behavioral: Negative.      Physical Exam:  weight is 155 lb (70.3 kg). His oral temperature is 97.9 F (36.6 C). His blood pressure is 119/81 and his pulse is  86. His respiration is 20 and oxygen saturation is 97%.   Wt Readings from Last 3 Encounters:  03/15/20 155 lb (70.3 kg)  09/22/19 156 lb 12.8 oz (71.1 kg)  03/24/19 158 lb 8 oz (71.9 kg)     Physical Exam Vitals reviewed.  HENT:     Head: Normocephalic and atraumatic.  Eyes:     Pupils: Pupils are equal, round, and reactive to light.  Cardiovascular:     Rate and Rhythm: Normal rate and regular rhythm.     Heart sounds: Normal heart sounds.  Pulmonary:     Effort: Pulmonary effort is normal.     Breath sounds: Normal breath sounds.  Abdominal:     General: Bowel sounds are normal.     Palpations: Abdomen is soft.  Musculoskeletal:        General: No tenderness or  deformity. Normal range of motion.     Cervical back: Normal range of motion.  Lymphadenopathy:     Cervical: No cervical adenopathy.  Skin:    General: Skin is warm and dry.     Findings: No erythema or rash.  Neurological:     Mental Status: He is alert and oriented to person, place, and time.  Psychiatric:        Behavior: Behavior normal.        Thought Content: Thought content normal.        Judgment: Judgment normal.      Lab Results  Component Value Date   WBC 6.2 03/15/2020   HGB 16.8 03/15/2020   HCT 49.3 03/15/2020   MCV 91.1 03/15/2020   PLT 270 03/15/2020     Chemistry      Component Value Date/Time   NA 134 (L) 03/15/2020 0856   NA 140 03/19/2017 0809   NA 137 09/11/2016 0822   K 4.2 03/15/2020 0856   K 4.0 03/19/2017 0809   K 4.1 09/11/2016 0822   CL 100 03/15/2020 0856   CL 103 03/19/2017 0809   CO2 27 03/15/2020 0856   CO2 25 03/19/2017 0809   CO2 22 09/11/2016 0822   BUN 21 03/15/2020 0856   BUN 16 03/19/2017 0809   BUN 13.6 09/11/2016 0822   CREATININE 1.19 03/15/2020 0856   CREATININE 1.0 03/19/2017 0809   CREATININE 0.9 09/11/2016 0822      Component Value Date/Time   CALCIUM 9.5 03/15/2020 0856   CALCIUM 9.0 03/19/2017 0809   CALCIUM 9.0 09/11/2016 0822   ALKPHOS 68 03/15/2020 0856   ALKPHOS 69 03/19/2017 0809   ALKPHOS 76 09/11/2016 0822   AST 19 03/15/2020 0856   AST 25 09/11/2016 0822   ALT 20 03/15/2020 0856   ALT 32 03/19/2017 0809   ALT 24 09/11/2016 0822   BILITOT 0.5 03/15/2020 0856   BILITOT 0.48 09/11/2016 0822      Impression and Plan: Logan Mendoza is a 69 year-old white male. He has a history of anaplastic large cell non-Hodgkin's lymphoma. 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  I do not see any evidence of recurrent disease.  I do not see an indication for any scans right now.  It is now been almost 5 years.  I think once we see him back in 6 months, we will then let him go from the clinic.  I am just very  happy that the lymphoma has not recurred.      Volanda Napoleon, MD 12/3/20219:33 AM

## 2020-03-18 ENCOUNTER — Telehealth: Payer: Self-pay | Admitting: Hematology & Oncology

## 2020-03-18 NOTE — Telephone Encounter (Signed)
Appointments scheduled calendar printed & mailed per 12/3 los 

## 2020-09-13 ENCOUNTER — Inpatient Hospital Stay (HOSPITAL_BASED_OUTPATIENT_CLINIC_OR_DEPARTMENT_OTHER): Payer: Managed Care, Other (non HMO) | Admitting: Family

## 2020-09-13 ENCOUNTER — Other Ambulatory Visit: Payer: Self-pay

## 2020-09-13 ENCOUNTER — Encounter: Payer: Self-pay | Admitting: Family

## 2020-09-13 ENCOUNTER — Inpatient Hospital Stay: Payer: Managed Care, Other (non HMO) | Attending: Family

## 2020-09-13 VITALS — BP 122/84 | HR 77 | Temp 97.7°F | Resp 17 | Ht 67.0 in | Wt 155.1 lb

## 2020-09-13 DIAGNOSIS — Z8572 Personal history of non-Hodgkin lymphomas: Secondary | ICD-10-CM | POA: Insufficient documentation

## 2020-09-13 DIAGNOSIS — C8462 Anaplastic large cell lymphoma, ALK-positive, intrathoracic lymph nodes: Secondary | ICD-10-CM

## 2020-09-13 DIAGNOSIS — Z9221 Personal history of antineoplastic chemotherapy: Secondary | ICD-10-CM | POA: Insufficient documentation

## 2020-09-13 DIAGNOSIS — Z923 Personal history of irradiation: Secondary | ICD-10-CM | POA: Insufficient documentation

## 2020-09-13 LAB — CBC WITH DIFFERENTIAL (CANCER CENTER ONLY)
Abs Immature Granulocytes: 0.03 10*3/uL (ref 0.00–0.07)
Basophils Absolute: 0 10*3/uL (ref 0.0–0.1)
Basophils Relative: 1 %
Eosinophils Absolute: 0.1 10*3/uL (ref 0.0–0.5)
Eosinophils Relative: 2 %
HCT: 47.7 % (ref 39.0–52.0)
Hemoglobin: 16.4 g/dL (ref 13.0–17.0)
Immature Granulocytes: 1 %
Lymphocytes Relative: 23 %
Lymphs Abs: 1.4 10*3/uL (ref 0.7–4.0)
MCH: 30.9 pg (ref 26.0–34.0)
MCHC: 34.4 g/dL (ref 30.0–36.0)
MCV: 90 fL (ref 80.0–100.0)
Monocytes Absolute: 0.3 10*3/uL (ref 0.1–1.0)
Monocytes Relative: 5 %
Neutro Abs: 4 10*3/uL (ref 1.7–7.7)
Neutrophils Relative %: 68 %
Platelet Count: 237 10*3/uL (ref 150–400)
RBC: 5.3 MIL/uL (ref 4.22–5.81)
RDW: 12.4 % (ref 11.5–15.5)
WBC Count: 5.8 10*3/uL (ref 4.0–10.5)
nRBC: 0 % (ref 0.0–0.2)

## 2020-09-13 LAB — CMP (CANCER CENTER ONLY)
ALT: 21 U/L (ref 0–44)
AST: 21 U/L (ref 15–41)
Albumin: 4.2 g/dL (ref 3.5–5.0)
Alkaline Phosphatase: 72 U/L (ref 38–126)
Anion gap: 7 (ref 5–15)
BUN: 15 mg/dL (ref 8–23)
CO2: 28 mmol/L (ref 22–32)
Calcium: 9.6 mg/dL (ref 8.9–10.3)
Chloride: 103 mmol/L (ref 98–111)
Creatinine: 1.02 mg/dL (ref 0.61–1.24)
GFR, Estimated: >60 mL/min (ref >60)
Glucose, Bld: 116 mg/dL — ABNORMAL HIGH (ref 70–99)
Potassium: 4.5 mmol/L (ref 3.5–5.1)
Sodium: 138 mmol/L (ref 135–145)
Total Bilirubin: 0.5 mg/dL (ref 0.3–1.2)
Total Protein: 6.6 g/dL (ref 6.5–8.1)

## 2020-09-13 LAB — LACTATE DEHYDROGENASE: LDH: 134 U/L (ref 98–192)

## 2020-09-13 NOTE — Progress Notes (Signed)
Hematology and Oncology Follow Up Visit  Logan Mendoza [id number] 1950/07/28 70 y.o. 09/13/2020   Principle Diagnosis:  Anaplastic large cell non-Hodgkin's lymphoma-ALK (+)  Past Therapy:        Status post cycle 8 of CHOP - completed in 06/06/2015 S/p XRT to the right pelvis-completed 09/27/2015  Current Therapy: Observation   Interim History:  [name] is here today for follow-up. He is doing quite well and has no complaints at this time.  He denies fatigue/weakness.  No fever, chills, n/v, cough, rash, dizziness, SOB, chest pain, palpitations, abdominal pain or bloating, changes in bowel or bladder habits.  No bleeding, bruising or petechiae.  No adenopathy noted on exam.  No swelling or tenderness in his extremities.  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 This has not effected his balance or gait so far.  No falls or syncope to report.  He has maintained a good appetite and is staying well hydrated. His weight is stable at 155 lbs.   ECOG Performance Status: 0 - Asymptomatic  Medications:  Allergies as of 09/13/2020      Reactions   Sulfa Antibiotics Hives   Sucralfate Rash   Sulfa Drugs Cross Reactors Itching, Rash   Sulfamethoxazole-trimethoprim Rash      Medication List       Accurate as of September 13, 2020 10:13 AM. If you have any questions, ask your nurse or doctor.        aspirin EC 81 MG tablet Take 81 mg by mouth.   B-D 3CC LUER-LOK SYR 25GX1" 25G X 1" 3 ML Misc Generic drug: SYRINGE-NEEDLE (DISP) 3 ML See admin instructions.   Cholecalciferol 25 MCG (1000 UT) tablet Take 2,000 Units by mouth 3 (three) times daily.   CHOLEST OFF PO Take 300 mg by mouth daily.   co-enzyme Q-10 30 MG capsule Take 200 mg by mouth daily.   finasteride 5 MG tablet Commonly known as: PROSCAR Take 5 mg by mouth daily.   GLUCOSAMINE HCL PO Take 1 tablet by mouth daily.   latanoprost 0.005 % ophthalmic solution Commonly known  as: XALATAN Place 1 drop into both eyes at bedtime.   Milk Thistle 140 MG Caps Take 140 mg by mouth daily.   NIFEdipine 30 MG 24 hr tablet Commonly known as: PROCARDIA-XL/NIFEDICAL-XL Take 30 mg by mouth.   RED YEAST RICE PO Take 600 mg by mouth 2 (two) times daily.   saw palmetto 500 MG capsule Take 500 mg by mouth daily.   testosterone cypionate 100 MG/ML injection Commonly known as: DEPOTESTOTERONE CYPIONATE Inject 100 mg into the muscle every 14 (fourteen) days.   testosterone cypionate 200 MG/ML injection Commonly known as: DEPOTESTOSTERONE CYPIONATE SMARTSIG:0.4 Milliliter(s) IM Every 2 Weeks       Allergies:  Allergies  Allergen Reactions  . Sulfa Antibiotics Hives  . Sucralfate Rash  . Sulfa Drugs Cross Reactors Itching and Rash  . Sulfamethoxazole-Trimethoprim Rash    Past Medical History, Surgical history, Social history, and Family History were reviewed and updated.  Review of Systems: All other 10 point review of systems is negative.   Physical Exam:  height is 5' 7"  (1.702 m) and weight is 155 lb 1.9 oz (70.4 kg). His oral temperature is 97.7 F (36.5 C). His blood pressure is 122/84 and his pulse is 77. His respiration is 17 and oxygen saturation is 99%.   Wt Readings from Last 3 Encounters:  09/13/20 155 lb 1.9 oz (70.4  kg)  03/15/20 155 lb (70.3 kg)  09/22/19 156 lb 12.8 oz (71.1 kg)    Ocular: Sclerae unicteric, pupils equal, round and reactive to light Ear-nose-throat: Oropharynx clear, dentition fair Lymphatic: No cervical, supraclavicular or axillary adenopathy Lungs no rales or rhonchi, good excursion bilaterally Heart regular rate and rhythm, no murmur appreciated Abd soft, nontender, positive bowel sounds MSK no focal spinal tenderness, no joint edema Neuro: non-focal, well-oriented, appropriate affect Breasts: Deferred   Lab Results  Component Value Date   WBC 5.8 09/13/2020   HGB 16.4 09/13/2020   HCT 47.7 09/13/2020   MCV  90.0 09/13/2020   PLT 237 09/13/2020   No results found for: FERRITIN, IRON, TIBC, UIBC, IRONPCTSAT Lab Results  Component Value Date   RBC 5.30 09/13/2020   No results found for: KPAFRELGTCHN, LAMBDASER, KAPLAMBRATIO No results found for: Kandis Cocking [facility] Lab Results  Component Value Date   TOTALPROTELP 6.5 12/10/2014   ALBUMINELP 3.3 (L) 12/10/2014   A1GS 0.4 (H) 12/10/2014   A2GS 1.1 (H) 12/10/2014   BETS 0.5 12/10/2014   BETA2SER 0.4 12/10/2014   GAMS 0.8 12/10/2014   SPEI * 12/10/2014     Chemistry      Component Value Date/Time   NA 138 09/13/2020 0900   NA 140 03/19/2017 0809   NA 137 09/11/2016 0822   K 4.5 09/13/2020 0900   K 4.0 03/19/2017 0809   K 4.1 09/11/2016 0822   CL 103 09/13/2020 0900   CL 103 03/19/2017 0809   CO2 28 09/13/2020 0900   CO2 25 03/19/2017 0809   CO2 22 09/11/2016 0822   BUN 15 09/13/2020 0900   BUN 16 03/19/2017 0809   BUN 13.6 09/11/2016 0822   CREATININE 1.02 09/13/2020 0900   CREATININE 1.0 03/19/2017 0809   CREATININE 0.9 09/11/2016 0822      Component Value Date/Time   CALCIUM 9.6 09/13/2020 0900   CALCIUM 9.0 03/19/2017 0809   CALCIUM 9.0 09/11/2016 0822   ALKPHOS 72 09/13/2020 0900   ALKPHOS 69 03/19/2017 0809   ALKPHOS 76 09/11/2016 0822   AST 21 09/13/2020 0900   AST 25 09/11/2016 0822   ALT 21 09/13/2020 0900   ALT 32 03/19/2017 0809   ALT 24 09/11/2016 0822   BILITOT 0.5 09/13/2020 0900   BILITOT 0.48 09/11/2016 0822       Impression and Plan: Logan Mendoza is a very pleasant 70 yo caucasian gentleman with history of anaplastic large cell non-Hodgkin's lymphoma. T-cell lymphoma, ALK positive.  So far he has done well and there has been no evidence of recurrence.  He is now 5 years out from treatment. I spoke with Dr. Marin Olp and at this time we can let him go from our clinic.  Patient was encouraged to contact our office with any questions or concerns. We can certainly see him again if needed.   Laverna Peace, NP 6/3/202210:13 AM

## 2020-09-17 ENCOUNTER — Telehealth: Payer: Self-pay | Admitting: *Deleted

## 2020-09-17 NOTE — Telephone Encounter (Signed)
Per 09/13/20 los - no follow up needed

## 2021-06-06 ENCOUNTER — Other Ambulatory Visit: Payer: Self-pay

## 2021-06-06 ENCOUNTER — Encounter (HOSPITAL_BASED_OUTPATIENT_CLINIC_OR_DEPARTMENT_OTHER): Payer: Self-pay

## 2021-06-06 ENCOUNTER — Emergency Department (HOSPITAL_BASED_OUTPATIENT_CLINIC_OR_DEPARTMENT_OTHER)
Admission: EM | Admit: 2021-06-06 | Discharge: 2021-06-07 | Disposition: A | Discharge status: Home / Self Care | Payer: Managed Care, Other (non HMO) | Attending: Emergency Medicine | Admitting: Emergency Medicine

## 2021-06-06 DIAGNOSIS — I10 Essential (primary) hypertension: Secondary | ICD-10-CM | POA: Diagnosis not present

## 2021-06-06 DIAGNOSIS — Z79899 Other long term (current) drug therapy: Secondary | ICD-10-CM | POA: Diagnosis not present

## 2021-06-06 DIAGNOSIS — Z7982 Long term (current) use of aspirin: Secondary | ICD-10-CM | POA: Diagnosis not present

## 2021-06-06 DIAGNOSIS — I959 Hypotension, unspecified: Secondary | ICD-10-CM | POA: Diagnosis not present

## 2021-06-06 DIAGNOSIS — R001 Bradycardia, unspecified: Secondary | ICD-10-CM | POA: Insufficient documentation

## 2021-06-06 DIAGNOSIS — E871 Hypo-osmolality and hyponatremia: Secondary | ICD-10-CM | POA: Diagnosis not present

## 2021-06-06 DIAGNOSIS — R42 Dizziness and giddiness: Secondary | ICD-10-CM | POA: Diagnosis present

## 2021-06-06 LAB — CBG MONITORING, ED: Glucose-Capillary: 119 mg/dL — ABNORMAL HIGH (ref 70–99)

## 2021-06-06 LAB — COMPREHENSIVE METABOLIC PANEL
ALT: 22 U/L (ref 0–44)
AST: 23 U/L (ref 15–41)
Albumin: 3.9 g/dL (ref 3.5–5.0)
Alkaline Phosphatase: 56 U/L (ref 38–126)
Anion gap: 9 (ref 5–15)
BUN: 19 mg/dL (ref 8–23)
CO2: 23 mmol/L (ref 22–32)
Calcium: 8.7 mg/dL — ABNORMAL LOW (ref 8.9–10.3)
Chloride: 98 mmol/L (ref 98–111)
Creatinine, Ser: 1.17 mg/dL (ref 0.61–1.24)
GFR, Estimated: >60 mL/min (ref >60)
Glucose, Bld: 132 mg/dL — ABNORMAL HIGH (ref 70–99)
Potassium: 3.4 mmol/L — ABNORMAL LOW (ref 3.5–5.1)
Sodium: 130 mmol/L — ABNORMAL LOW (ref 135–145)
Total Bilirubin: 0.8 mg/dL (ref 0.3–1.2)
Total Protein: 6.6 g/dL (ref 6.5–8.1)

## 2021-06-06 LAB — CBC WITH DIFFERENTIAL/PLATELET
Abs Immature Granulocytes: 0.06 10*3/uL (ref 0.00–0.07)
Basophils Absolute: 0.1 10*3/uL (ref 0.0–0.1)
Basophils Relative: 1 %
Eosinophils Absolute: 0.2 10*3/uL (ref 0.0–0.5)
Eosinophils Relative: 2 %
HCT: 45.1 % (ref 39.0–52.0)
Hemoglobin: 15.8 g/dL (ref 13.0–17.0)
Immature Granulocytes: 1 %
Lymphocytes Relative: 30 %
Lymphs Abs: 3 10*3/uL (ref 0.7–4.0)
MCH: 31.1 pg (ref 26.0–34.0)
MCHC: 35 g/dL (ref 30.0–36.0)
MCV: 88.8 fL (ref 80.0–100.0)
Monocytes Absolute: 0.6 10*3/uL (ref 0.1–1.0)
Monocytes Relative: 6 %
Neutro Abs: 6 10*3/uL (ref 1.7–7.7)
Neutrophils Relative %: 60 %
Platelets: 321 10*3/uL (ref 150–400)
RBC: 5.08 MIL/uL (ref 4.22–5.81)
RDW: 12.7 % (ref 11.5–15.5)
WBC: 9.8 10*3/uL (ref 4.0–10.5)
nRBC: 0 % (ref 0.0–0.2)

## 2021-06-06 LAB — MAGNESIUM: Magnesium: 2 mg/dL (ref 1.7–2.4)

## 2021-06-06 LAB — BRAIN NATRIURETIC PEPTIDE: B Natriuretic Peptide: 28.1 pg/mL (ref 0.0–100.0)

## 2021-06-06 LAB — TROPONIN I (HIGH SENSITIVITY)
Troponin I (High Sensitivity): 6 ng/L (ref <18)
Troponin I (High Sensitivity): 6 ng/L (ref <18)

## 2021-06-06 MED ORDER — SODIUM CHLORIDE 0.9 % IV BOLUS
1000.0000 mL | Freq: Once | INTRAVENOUS | Status: AC
  Administered 2021-06-06: 1000 mL via INTRAVENOUS

## 2021-06-06 MED ORDER — SODIUM CHLORIDE 0.9 % IV BOLUS
500.0000 mL | Freq: Once | INTRAVENOUS | Status: AC
  Administered 2021-06-06: 500 mL via INTRAVENOUS

## 2021-06-06 NOTE — ED Notes (Signed)
During triage, patient became diaphoretic and lethargic.  BP rechecked and is 60/38.  Pulse 49.  Patient arousable to sternal rub.  EKG performed.  Charge nurse Lattie Haw, RN notified.  Patient assisted to wheelchair and taken to patient room.

## 2021-06-06 NOTE — ED Triage Notes (Signed)
Patient complains of moderate dizziness and ringing in ears since this afternoon.

## 2021-06-06 NOTE — ED Provider Notes (Addendum)
[facility] Provider Note   CSN: [id number] Arrival date & time: 06/06/21  1921     History  Chief Complaint  Patient presents with   Hypertension    Logan Mendoza is a 71 y.o. male.  HPI  70 year old male with past medical history of HTN, atrial fibrillation status post ablation, kidney stones presents to the emergency department after an episode of feeling sick.  Patient states for the past couple weeks to a month he has been having indigestion.  He is seeing cardiology as an outpatient and had a stress echo that was normal.  He is being treated for reflux.  Tonight after dinner he was sitting down watching TV when he "felt unwell".  He states that he felt dizzy, clammy, was diaphoretic.  When EMS got there he was hypertensive.  On arrival in triage patient was noted to be bradycardic and hypotensive.  Improved with laying flat.  No active chest pain, back pain or shortness of breath.  No swelling of his lower extremities.  No recent fever or illness.  Home Medications Prior to Admission medications   Medication Sig Start Date End Date Taking? Authorizing Provider  aspirin EC 81 MG tablet Take 81 mg by mouth.    [provider]  B-D 3CC LUER-LOK SYR 25GX1" 25G X 1" 3 ML MISC See admin instructions. 01/05/19   [provider]  Cholecalciferol 25 MCG (1000 UT) tablet Take 2,000 Units by mouth 3 (three) times daily.     [provider]  co-enzyme Q-10 30 MG capsule Take 200 mg by mouth daily.     [provider]  finasteride (PROSCAR) 5 MG tablet Take 5 mg by mouth daily.    [provider]  GLUCOSAMINE HCL PO Take 1 tablet by mouth daily.    [provider]  latanoprost (XALATAN) 0.005 % ophthalmic solution Place 1 drop into both eyes at bedtime. 09/07/19   [provider]  Milk Thistle 140 MG CAPS Take 140 mg by mouth daily.     [provider]  NIFEdipine  (PROCARDIA-XL/ADALAT-CC/NIFEDICAL-XL) 30 MG 24 hr tablet Take 30 mg by mouth.    [provider]  Plant Sterols and Stanols (CHOLEST OFF PO) Take 300 mg by mouth daily.    [provider]  Red Yeast Rice Extract (RED YEAST RICE PO) Take 600 mg by mouth 2 (two) times daily.    [provider]  saw palmetto 500 MG capsule Take 500 mg by mouth daily.     [provider]  testosterone cypionate (DEPOTESTOSTERONE CYPIONATE) 200 MG/ML injection SMARTSIG:0.4 Milliliter(s) IM Every 2 Weeks 03/13/19   [provider]  testosterone cypionate (DEPOTESTOTERONE CYPIONATE) 100 MG/ML injection Inject 100 mg into the muscle every 14 (fourteen) days.    [provider]      Allergies    Sulfa antibiotics, Sucralfate, Sulfa drugs cross reactors, and Sulfamethoxazole-trimethoprim    Review of Systems   Review of Systems  Constitutional:  Positive for diaphoresis and fatigue. Negative for fever.  Respiratory:  Negative for shortness of breath.   Cardiovascular:  Negative for chest pain.  Gastrointestinal:  Negative for abdominal pain, diarrhea and vomiting.  Skin:  Negative for rash.  Neurological:  Positive for dizziness. Negative for headaches.   Physical Exam Updated Vital Signs BP 136/88    Pulse 87    Temp 98.6 F (37 C) (Oral)    Resp 16    Ht 5\' 7"  (  1.702 m)    Wt 71.2 kg    SpO2 95%    BMI 24.59 kg/m  Physical Exam Vitals and nursing note reviewed.  Constitutional:      General: He is not in acute distress.    Appearance: Normal appearance. He is not diaphoretic.  HENT:     Head: Normocephalic.     Mouth/Throat:     Mouth: Mucous membranes are moist.  Cardiovascular:     Rate and Rhythm: Normal rate.  Pulmonary:     Effort: Pulmonary effort is normal. No respiratory distress.  Abdominal:     Palpations: Abdomen is soft.     Tenderness: There is no abdominal tenderness.  Skin:    General: Skin is warm.  Neurological:     General: No  focal deficit present.     Mental Status: He is alert and oriented to person, place, and time. Mental status is at baseline.  Psychiatric:        Mood and Affect: Mood normal.    ED Results / Procedures / Treatments   Labs (all labs ordered are listed, but only abnormal results are displayed) Labs Reviewed  COMPREHENSIVE METABOLIC PANEL - Abnormal; Notable for the following components:      Result Value   Sodium 130 (*)    Potassium 3.4 (*)    Glucose, Bld 132 (*)    Calcium 8.7 (*)    All other components within normal limits  CBG MONITORING, ED - Abnormal; Notable for the following components:   Glucose-Capillary 119 (*)    All other components within normal limits  CBC WITH DIFFERENTIAL/PLATELET  BRAIN NATRIURETIC PEPTIDE  MAGNESIUM  TROPONIN I (HIGH SENSITIVITY)  TROPONIN I (HIGH SENSITIVITY)    EKG EKG Interpretation  Date/Time:  Friday June 06 2021 19:55:22 EST Ventricular Rate:  71 PR Interval:  183 QRS Duration: 95 QT Interval:  405 QTC Calculation: 441 R Axis:   69 Text Interpretation: Sinus rhythm Confirmed by Lavenia Atlas ([id number]) on 06/06/2021 9:59:12 PM  Radiology No results found.  Procedures Procedures    Medications Ordered in ED Medications  sodium chloride 0.9 % bolus 1,000 mL (1,000 mLs Intravenous New Bag/Given (Non-Interop) 06/06/21 2303)  sodium chloride 0.9 % bolus 500 mL (500 mLs Intravenous New Bag/Given 06/06/21 2315)    ED Course/ Medical Decision Making/ A&P                           Medical Decision Making Amount and/or Complexity of Data Reviewed Labs: ordered.   71 year old male presents emergency department after an episode of feeling unwell.  He reported diaphoresis, clamminess, was hypertensive with EMS.  On arrival in triage he was bradycardic and hypotensive.  This resolved after laying the patient flat.  He received some fluids and his vitals normalized.  Initially his orthostatic vitals were positive from sitting to  standing with a greater than 20 point drop on the systolic.  EKG is reassuring at baseline for the patient.  No active chest pain or back pain.  He states that he feels much improved.  Admits that he has not been drinking per baseline but denies any other recent illness, vomiting or diarrhea.  Blood work shows mild hyponatremia, possibly going along with hypovolemia.  Otherwise very reassuring from a cardiac standpoint, negative troponin.  Even more reassuring patient recently had a outpatient stress echo.  However given the concerning story especially with the bradycardia and hypotension consult with  cardiology fellow.  They are reassured with the current work-up and negative troponin.  They believe that this was most likely a vasovagal response when he got here to the hospital.  He is improved with fluids.  On reevaluation he feels well, looks well.  Plan to wait for repeat troponin to ambulate the patient.  If he is improved we feel comfortable discharging him from a medical standpoint for continued outpatient follow-up/cardiology evaluation.  Repeat troponin is negative, no delta.  I am very reassured from a cardiac standpoint.  This sounds like dehydration/hypovolemia with a vasovagal episode.  Patient feels great and back to baseline.  After he finishes fluids we will ambulate him.  If he feels well we will plan for discharge and outpatient follow-up.      Final Clinical Impression(s) / ED Diagnoses Final diagnoses:  None    Rx / DC Orders ED Discharge Orders     None         Lorelle Gibbs, DO 06/06/21 2339    Lorelle Gibbs, DO 06/06/21 2350

## 2021-06-06 NOTE — ED Triage Notes (Signed)
Patient states he has been generally feeling ill since this evening.  He states his blood pressure was elevated at home.  Patient also complains of belching which has been going on for 2-3 weeks.  Patient has been taking nexium.  PMH of atrial ablation around 2015.

## 2021-06-06 NOTE — ED Notes (Signed)
Patient complains of "shaking."  Patient given a urinal per request.

## 2021-06-07 NOTE — Discharge Instructions (Signed)
You have been seen and discharged from the emergency department.  Your cardiac work-up was normal.  You were found to have a low blood pressure with low sodium, most likely low blood volume/dehydration.  You are given IV fluids with improvement.  Stay well-hydrated.  Follow-up with your primary provider for further evaluation and further care. Take home medications as prescribed. If you have any worsening symptoms or further concerns for your health please return to an emergency department for further evaluation.

## 2021-06-08 ENCOUNTER — Encounter (HOSPITAL_BASED_OUTPATIENT_CLINIC_OR_DEPARTMENT_OTHER): Payer: Self-pay | Admitting: Emergency Medicine

## 2021-06-08 ENCOUNTER — Emergency Department (HOSPITAL_BASED_OUTPATIENT_CLINIC_OR_DEPARTMENT_OTHER)
Admission: EM | Admit: 2021-06-08 | Discharge: 2021-06-08 | Disposition: A | Discharge status: Home / Self Care | Payer: Managed Care, Other (non HMO) | Attending: Emergency Medicine | Admitting: Emergency Medicine

## 2021-06-08 ENCOUNTER — Emergency Department (HOSPITAL_BASED_OUTPATIENT_CLINIC_OR_DEPARTMENT_OTHER): Payer: Managed Care, Other (non HMO)

## 2021-06-08 ENCOUNTER — Other Ambulatory Visit: Payer: Self-pay

## 2021-06-08 DIAGNOSIS — I10 Essential (primary) hypertension: Secondary | ICD-10-CM | POA: Diagnosis present

## 2021-06-08 DIAGNOSIS — Z7982 Long term (current) use of aspirin: Secondary | ICD-10-CM | POA: Diagnosis not present

## 2021-06-08 DIAGNOSIS — K30 Functional dyspepsia: Secondary | ICD-10-CM

## 2021-06-08 NOTE — Discharge Instructions (Addendum)
Keep a food diary but you may want to limit the amount of mild products.  Also try not to eat 2 hours before laying down.  You can try tums if you start getting symptoms.  Continue the plan to follow up with GI for endoscopy.  If you start having persistent vomiting, your symptoms don't resolve then you should come back and may need a CAT scan

## 2021-06-08 NOTE — ED Provider Notes (Addendum)
[facility] Provider Note   CSN: [id number] Arrival date & time: 06/08/21  0944     History  Chief Complaint  Patient presents with   Hypertension    537 systolic    Logan Mendoza is a 71 y.o. male.  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  Patient reports the episode today was similar to the episode he had when he presented to the ER 2 days ago.  He reports that today when he woke up he felt remove his normal state of health.  That was around 5 AM.  He got some water and went back to bed we woke up at 7:00 he reported he felt a little bit shaky and decided he needed to eat something.  He had a [place] yogurt and something to drink and approximately an hour or less later he started having significant symptoms of indigestion with burping, feeling like his heart was racing and feeling shaky.  This lasted approximately 5 to 10 minutes and then started to improve and then approximately 30 minutes to an hour later he had another episode which was present until he got here and it is now resolved.  The last episode he had had like this was on Friday but reports that he has been having intermittent issues for last few months they were just more prominent this weekend.  He had started taking Nexium for 5 weeks ago and has tried to be very careful with his diet.  He has not not had any nausea or vomiting reports his bowel movements have been normal but he has been eating less because it does seem to trigger when he feels bad.  His symptoms do not seem to be exertional.  He has followed up with cardiologist and had a stress echo done within the last few weeks that was normal as well as has been seeing GI and has an  endoscopy scheduled.  He has no known heart issues other than the atrial fibrillation and has not had any issues since his ablation.  All the medications he is taking he has been on for quite some time except the Nexium which he started in the above timeframe.  He denies any shortness of breath during these episodes denies any cough or fever recently.  The history is provided by the patient and medical records.  Hypertension      Home Medications Prior to Admission medications   Medication Sig Start Date End Date Taking? Authorizing Provider  aspirin EC 81 MG tablet Take 81 mg by mouth.    [provider]  B-D 3CC LUER-LOK SYR 25GX1" 25G X 1" 3 ML MISC See admin instructions. 01/05/19   [provider]  Cholecalciferol 25 MCG (1000 UT) tablet Take 2,000 Units by mouth 3 (three) times daily.     [provider]  co-enzyme Q-10 30 MG capsule Take 200 mg by mouth daily.     [provider]  finasteride (PROSCAR) 5 MG tablet Take 5 mg by mouth daily.    [provider]  GLUCOSAMINE HCL PO Take 1 tablet by mouth daily.    [provider]  latanoprost (XALATAN) 0.005 % ophthalmic solution Place 1 drop into both eyes at bedtime. 09/07/19  [provider]  Milk Thistle 140 MG CAPS Take 140 mg by mouth daily.     [provider]  NIFEdipine (PROCARDIA-XL/ADALAT-CC/NIFEDICAL-XL) 30 MG 24 hr tablet Take 30 mg by mouth.    [provider]  Plant Sterols and Stanols (CHOLEST OFF PO) Take 300 mg by mouth daily.    [provider]  Red Yeast Rice Extract (RED YEAST RICE PO) Take 600 mg by mouth 2 (two) times daily.    [provider]  saw palmetto 500 MG capsule Take 500 mg by mouth daily.     [provider]  testosterone cypionate (DEPOTESTOSTERONE CYPIONATE) 200 MG/ML injection SMARTSIG:0.4 Milliliter(s) IM Every 2 Weeks 03/13/19   [provider]  testosterone cypionate (DEPOTESTOTERONE  CYPIONATE) 100 MG/ML injection Inject 100 mg into the muscle every 14 (fourteen) days.    [provider]      Allergies    Sulfa antibiotics, Sucralfate, Sulfa drugs cross reactors, and Sulfamethoxazole-trimethoprim    Review of Systems   Review of Systems  Physical Exam Updated Vital Signs BP (!) 138/96 (BP Location: Right Arm)    Pulse 82    Temp 97.6 F (36.4 C) (Oral)    Resp 16    Ht 5\' 7"  (1.702 m)    Wt 71.2 kg    SpO2 98%    BMI 24.59 kg/m  Physical Exam Vitals and nursing note reviewed.  Constitutional:      General: He is not in acute distress.    Appearance: He is well-developed.  HENT:     Head: Normocephalic and atraumatic.     Nose: Nose normal.  Eyes:     Conjunctiva/sclera: Conjunctivae normal.     Pupils: Pupils are equal, round, and reactive to light.  Cardiovascular:     Rate and Rhythm: Normal rate and regular rhythm.     Heart sounds: No murmur heard. Pulmonary:     Effort: Pulmonary effort is normal. No respiratory distress.     Breath sounds: Normal breath sounds. No wheezing or rales.  Abdominal:     General: There is no distension.     Palpations: Abdomen is soft.     Tenderness: There is no abdominal tenderness. There is no guarding or rebound.     Comments: Well-healed epigastric scar  Musculoskeletal:        General: No tenderness. Normal range of motion.     Cervical back: Normal range of motion and neck supple.     Right lower leg: No edema.     Left lower leg: No edema.  Skin:    General: Skin is warm and dry.     Findings: No erythema or rash.  Neurological:     Mental Status: He is alert and oriented to person, place, and time. Mental status is at baseline.  Psychiatric:        Mood and Affect: Mood normal.        Behavior: Behavior normal.    ED Results / Procedures / Treatments   Labs (all labs ordered are listed, but only abnormal results are displayed) Labs Reviewed - No data to display  EKG EKG  Interpretation  Date/Time:  Sunday June 08 2021 09:50:46 EST Ventricular Rate:  84 PR Interval:  180 QRS Duration: 95 QT Interval:  366 QTC Calculation: 433 R Axis:   50 Text Interpretation: Sinus rhythm Abnormal R-wave progression, early transition Minimal ST depression, inferior leads No significant change since last tracing Confirmed by Blanchie Dessert (  [id number]) on 06/08/2021 9:57:59 AM  Radiology DG Chest Port 1 View  Result Date: 06/08/2021 CLINICAL DATA:  Chest pain.  Hypertension. EXAM: PORTABLE CHEST 1 VIEW COMPARISON:  None. FINDINGS: The heart size and mediastinal contours are within normal limits. Both lungs are clear. The visualized skeletal structures are unremarkable. IMPRESSION: No active disease. Electronically Signed   By: Marlaine Hind M.D.   On: 06/08/2021 10:50    Procedures Procedures    Medications Ordered in ED Medications - No data to display  ED Course/ Medical Decision Making/ A&P                           Medical Decision Making Amount and/or Complexity of Data Reviewed Radiology: ordered and independent interpretation performed. Decision-making details documented in ED Course. ECG/medicine tests: ordered and independent interpretation performed. Decision-making details documented in ED Course.  Risk OTC drugs.   Patient is a very pleasant 71 year old male presenting today with nonspecific palpitations, chest discomfort type symptoms and feeling generally unwell.  He has had several episodes over over the weekend but prior to that had them to a lesser extent over the last month or 2.  I reviewed patient's external medical records from cardiology and he has recently had a reassuring stress echo and reports his symptoms do not seem to be exertional.  They do however always seem to be related with food and concerned that patient's symptoms are GI in nature.  He has no abdominal pain on exam today to suggest gallbladder pathology.  No enlargement of the  liver or pancreatitis type symptoms.  Patient would drink a glass of wine a day but has stopped doing that this week.  He also denies any cough or significant congestion and has no shortness of breath at this time.  Patient is hypertensive here with a normal heart rate and oxygen saturation.  He has been checking his blood pressure regularly and reports this morning when he woke up it was normal but went up when all of this was occurring.  The blood pressure medication he is on he has been on for quite some time and has been discussing with his cardiologist whether they need to increase the dose.  His initial blood pressure here was 161/93 but with time spontaneously is now down to 138/96.  Patient was evaluated in the emergency room 2 days ago for the same type of symptoms.  At that time he had blood work done that showed a delta troponin that was negative, unchanged EKG, CMP with normal renal function but mildly decreased sodium and potassium.  Patient is well-appearing on exam here and reports his symptoms are gone.  I independently interpreted patient's EKG which shows no acute findings and his chest x-ray which I independently viewed and interpreted as negative for acute findings.  Low suspicion today that patient's complaints are related to ACS, dissection, tamponade, pericarditis, myocarditis or PE.  No evidence to suggest pneumothorax and no evidence of atrial fibrillation.  Discussed with the patient following up with GI.  Discussed possibly doing a CAT scan to look for hiatal hernias or other issues as the cause however patient reported he would prefer to be referred back to GI and they are planning for endoscopy but if that is negative then he may end up with a CAT scan.  No evidence to suggest obstruction at this time.  Patient discharged home in good condition.  He does not meet my admission  criteria at this time.  Everything was discussed with the patient and his partner who is in the room.  They have  no further questions.  He will try taking Tums when this happens again to see if that helps and keep a food diary.  He thinks lactose may be a trigger so he is going to try and decrease that in his diet.        Final Clinical Impression(s) / ED Diagnoses Final diagnoses:  Indigestion  Hypertension, unspecified type    Rx / DC Orders ED Discharge Orders     None         Blanchie Dessert, MD 06/08/21 1109    Blanchie Dessert, MD 06/08/21 1130

## 2021-06-08 NOTE — ED Triage Notes (Signed)
Pt reports continues to have problems with his blood pressure. His readings have been 290'S systolic. Pt seen here Friday for similar issue and reports experiencing the shaky feeling he had on Friday. Pt experienced 1 episode of pain under left armpit that has since resolved. Pt also has had increased belching.

## 2023-07-22 ENCOUNTER — Encounter (HOSPITAL_BASED_OUTPATIENT_CLINIC_OR_DEPARTMENT_OTHER): Payer: Self-pay | Admitting: Emergency Medicine

## 2023-07-22 ENCOUNTER — Other Ambulatory Visit: Payer: Self-pay

## 2023-07-22 ENCOUNTER — Emergency Department (HOSPITAL_BASED_OUTPATIENT_CLINIC_OR_DEPARTMENT_OTHER)
Admission: EM | Admit: 2023-07-22 | Discharge: 2023-07-22 | Disposition: A | Discharge status: Home / Self Care | Attending: Emergency Medicine | Admitting: Emergency Medicine

## 2023-07-22 DIAGNOSIS — R252 Cramp and spasm: Secondary | ICD-10-CM | POA: Insufficient documentation

## 2023-07-22 DIAGNOSIS — I10 Essential (primary) hypertension: Secondary | ICD-10-CM | POA: Diagnosis present

## 2023-07-22 DIAGNOSIS — Z7982 Long term (current) use of aspirin: Secondary | ICD-10-CM | POA: Diagnosis not present

## 2023-07-22 DIAGNOSIS — Z79899 Other long term (current) drug therapy: Secondary | ICD-10-CM | POA: Insufficient documentation

## 2023-07-22 LAB — BASIC METABOLIC PANEL WITH GFR
Anion gap: 12 (ref 5–15)
BUN: 11 mg/dL (ref 8–23)
CO2: 24 mmol/L (ref 22–32)
Calcium: 9.7 mg/dL (ref 8.9–10.3)
Chloride: 98 mmol/L (ref 98–111)
Creatinine, Ser: 0.91 mg/dL (ref 0.61–1.24)
GFR, Estimated: >60 mL/min (ref >60)
Glucose, Bld: 101 mg/dL — ABNORMAL HIGH (ref 70–99)
Potassium: 3.9 mmol/L (ref 3.5–5.1)
Sodium: 134 mmol/L — ABNORMAL LOW (ref 135–145)

## 2023-07-22 MED ORDER — LISINOPRIL 10 MG PO TABS: 10.0000 mg | ORAL_TABLET | Freq: Every day | ORAL | 2 refills | Status: AC

## 2023-07-22 MED ORDER — HYDROCHLOROTHIAZIDE 12.5 MG PO TABS: 12.5000 mg | ORAL_TABLET | Freq: Every day | ORAL | 2 refills | Status: DC

## 2023-07-22 NOTE — ED Provider Notes (Signed)
  EMERGENCY DEPARTMENT AT [facility] Provider Note   CSN: [id number] Arrival date & time: 07/22/23  1149     History  Chief Complaint  Patient presents with   Hypertension    Logan Mendoza is a 73 y.o. male.  73 year old male with history of hypertension who presents to the emergency department with elevated blood pressure.  Patient reports that his blood pressure is usually in the 120 systolic.  Reports of the past few days has been in the 150s over 90s.  Says that he has had some occasional cramping of his left foot.  No swelling of the foot.  Denies any other symptoms.  Says he is on nifedipine 30 mg daily and has been compliant with this.  Also is on a low-sodium diet.       Home Medications Prior to Admission medications   Medication Sig Start Date End Date Taking? Authorizing Provider  lisinopril (ZESTRIL) 10 MG tablet Take 1 tablet (10 mg total) by mouth daily. 07/22/23  Yes Rondel Baton, MD  aspirin EC 81 MG tablet Take 81 mg by mouth.    [provider]  B-D 3CC LUER-LOK SYR 25GX1" 25G X 1" 3 ML MISC See admin instructions. 01/05/19   [provider]  Cholecalciferol 25 MCG (1000 UT) tablet Take 2,000 Units by mouth 3 (three) times daily.     [provider]  co-enzyme Q-10 30 MG capsule Take 200 mg by mouth daily.     [provider]  finasteride (PROSCAR) 5 MG tablet Take 5 mg by mouth daily.    [provider]  GLUCOSAMINE HCL PO Take 1 tablet by mouth daily.    [provider]  latanoprost (XALATAN) 0.005 % ophthalmic solution Place 1 drop into both eyes at bedtime. 09/07/19   [provider]  Milk Thistle 140 MG CAPS Take 140 mg by mouth daily.     [provider]  NIFEdipine (PROCARDIA-XL/ADALAT-CC/NIFEDICAL-XL) 30 MG 24 hr tablet Take 30 mg by mouth.    [provider]  Plant Sterols and Stanols (CHOLEST OFF PO) Take 300 mg by mouth daily.    [provider]  Red Yeast Rice Extract (RED YEAST RICE PO) Take 600 mg by mouth 2 (two) times daily.    [provider]  saw palmetto 500 MG capsule Take 500 mg by mouth daily.     [provider]  testosterone cypionate (DEPOTESTOSTERONE CYPIONATE) 200 MG/ML injection SMARTSIG:0.4 Milliliter(s) IM Every 2 Weeks 03/13/19   [provider]  testosterone cypionate (DEPOTESTOTERONE CYPIONATE) 100 MG/ML injection Inject 100 mg into the muscle every 14 (fourteen) days.    [provider]      Allergies    Sulfa antibiotics, Sucralfate, Sulfa drugs cross reactors, and Sulfamethoxazole-trimethoprim    Review of Systems   Review of Systems  Physical Exam Updated Vital Signs BP (!) 152/93   Pulse 84   Temp 97.6 F (36.4 C) (Oral)   Resp 17   SpO2 94%  Physical Exam Vitals and nursing note reviewed.  Constitutional:      General: He is not in acute distress.    Appearance: He is well-developed.  HENT:     Head: Normocephalic and atraumatic.     Right Ear: External ear normal.     Left Ear: External ear normal.     Nose: Nose normal.  Eyes:     Extraocular Movements: Extraocular movements intact.  Conjunctiva/sclera: Conjunctivae normal.     Pupils: Pupils are equal, round, and reactive to light.  Pulmonary:     Effort: Pulmonary effort is normal. No respiratory distress.  Musculoskeletal:     Cervical back: Normal range of motion and neck supple.     Right lower leg: No edema.     Left lower leg: No edema.     Comments: No tenderness palpation of the left foot.  No deformities.  No rashes.  DP pulses 2+ bilaterally.  Skin:    General: Skin is warm and dry.  Neurological:     Mental Status: He is alert. Mental status is at baseline.  Psychiatric:        Mood and Affect: Mood normal.        Behavior: Behavior normal.     ED Results / Procedures / Treatments   Labs (all labs ordered are listed, but only abnormal results are  displayed) Labs Reviewed  BASIC METABOLIC PANEL WITH GFR - Abnormal; Notable for the following components:      Result Value   Sodium 134 (*)    Glucose, Bld 101 (*)    All other components within normal limits    EKG None  Radiology No results found.  Procedures Procedures    Medications Ordered in ED Medications - No data to display  ED Course/ Medical Decision Making/ A&P                                 Medical Decision Making Amount and/or Complexity of Data Reviewed Labs: ordered.  Risk Prescription drug management.   Logan Mendoza is a 73 y.o. male with comorbidities that complicate the patient evaluation including hypertension who presents to the emergency department with elevated blood pressure.  Initial Ddx:  Hypertensive emergency, ICH, CHF, MI, asymptomatic hypertension  MDM:  Patient presents emergency department with elevated blood pressure.  At this point in time does not have any symptoms of be concerning for hypertensive emergency such as severe headache, chest pain, or shortness of breath.  Will obtain lab work to assess for electrolytes and creatinine so that we can prescribe the proper blood pressure medication.  Is having some cramping in his foot but no signs of DVT at this time or other acute abnormality.  Plan:  BMP  ED Summary/Re-evaluation:  Patient's labs show mild hyponatremia 134.  Will start him on lisinopril instead of hydrochlorothiazide at this point in time.  Will have him follow-up with his primary doctor in several days regarding his blood pressure as well as his foot cramping.  Was counseled to look out for any signs of angioedema and return to the emergency department if he develops them.  This patient presents to the ED for concern of complaints listed in HPI, this involves an extensive number of treatment options, and is a complaint that carries with it a high risk of complications and morbidity. Disposition including potential  need for admission considered.   Dispo: DC Home. Return precautions discussed including, but not limited to, those listed in the AVS. Allowed pt time to ask questions which were answered fully prior to dc.  Additional history obtained from spouse Records reviewed Outpatient Clinic Notes The following labs were independently interpreted: Chemistry and show no acute abnormality I have reviewed the patients home medications and made adjustments as needed Social Determinants of health:  Geriatric   Final Clinical Impression(s) / ED  Diagnoses Final diagnoses:  Uncontrolled hypertension  Leg cramping    Rx / DC Orders ED Discharge Orders          Ordered    hydrochlorothiazide (HYDRODIURIL) 12.5 MG tablet  Daily,   Status:  Discontinued        07/22/23 1346    lisinopril (ZESTRIL) 10 MG tablet  Daily        07/22/23 1348              Rondel Baton, MD 07/22/23 1407

## 2023-07-22 NOTE — ED Notes (Signed)
 Patient called to check and see why 2 prescriptions were called in, he was only expecting one.  The second prescription was discontinued.  Verified with Nurse and advised patient he should only have one.    Medications-lisinopril (ZESTRIL) 10 MG tablet RP        1348  Discharge Orders Discontinued hydrochlorothiazide (HYDRODIURIL) 12.5 MG tablet

## 2023-07-22 NOTE — Discharge Instructions (Addendum)
 You were seen for your high blood pressure (hypertension) in the emergency department.   At home, please take the lisinopril that you were prescribed for your blood pressure.    Check your MyChart online for the results of any tests that had not resulted by the time you left the emergency department.   Follow-up with your primary doctor in 2-3 days regarding your visit.    Return immediately to the emergency department if you experience any of the following: Difficulty breathing, lip swelling, tongue swelling, severe headache, vision changes, numbness or weakness of your arms or legs, difficulty breathing, chest pain, or any other concerning symptoms.    Thank you for visiting our Emergency Department. It was a pleasure taking care of you today.

## 2023-07-22 NOTE — ED Triage Notes (Signed)
 C/o HTN over the last couple of days. Denies any recent changes to medications. States normally BP is well controlled.

## 2024-04-02 ENCOUNTER — Encounter (HOSPITAL_BASED_OUTPATIENT_CLINIC_OR_DEPARTMENT_OTHER): Payer: Self-pay

## 2024-04-02 ENCOUNTER — Emergency Department (HOSPITAL_BASED_OUTPATIENT_CLINIC_OR_DEPARTMENT_OTHER)

## 2024-04-02 ENCOUNTER — Emergency Department (HOSPITAL_BASED_OUTPATIENT_CLINIC_OR_DEPARTMENT_OTHER)
Admission: EM | Admit: 2024-04-02 | Discharge: 2024-04-02 | Disposition: A | Discharge status: Home / Self Care | Source: Other Acute Inpatient Hospital | Attending: Emergency Medicine | Admitting: Emergency Medicine

## 2024-04-02 ENCOUNTER — Other Ambulatory Visit: Payer: Self-pay

## 2024-04-02 DIAGNOSIS — K30 Functional dyspepsia: Secondary | ICD-10-CM | POA: Diagnosis not present

## 2024-04-02 DIAGNOSIS — I10 Essential (primary) hypertension: Secondary | ICD-10-CM | POA: Diagnosis not present

## 2024-04-02 DIAGNOSIS — R0789 Other chest pain: Secondary | ICD-10-CM | POA: Insufficient documentation

## 2024-04-02 DIAGNOSIS — Z7982 Long term (current) use of aspirin: Secondary | ICD-10-CM | POA: Diagnosis not present

## 2024-04-02 LAB — CBC
HCT: 45 % (ref 39.0–52.0)
Hemoglobin: 15.8 g/dL (ref 13.0–17.0)
MCH: 30.6 pg (ref 26.0–34.0)
MCHC: 35.1 g/dL (ref 30.0–36.0)
MCV: 87 fL (ref 80.0–100.0)
Platelets: 295 K/uL (ref 150–400)
RBC: 5.17 MIL/uL (ref 4.22–5.81)
RDW: 12.3 % (ref 11.5–15.5)
WBC: 9 K/uL (ref 4.0–10.5)
nRBC: 0 % (ref 0.0–0.2)

## 2024-04-02 LAB — BASIC METABOLIC PANEL WITH GFR
Anion gap: 11 (ref 5–15)
BUN: 15 mg/dL (ref 8–23)
CO2: 27 mmol/L (ref 22–32)
Calcium: 9.5 mg/dL (ref 8.9–10.3)
Chloride: 95 mmol/L — ABNORMAL LOW (ref 98–111)
Creatinine, Ser: 0.84 mg/dL (ref 0.61–1.24)
GFR, Estimated: >60 mL/min
Glucose, Bld: 97 mg/dL (ref 70–99)
Potassium: 4.3 mmol/L (ref 3.5–5.1)
Sodium: 134 mmol/L — ABNORMAL LOW (ref 135–145)

## 2024-04-02 LAB — HEPATIC FUNCTION PANEL
ALT: 19 U/L (ref 0–44)
AST: 25 U/L (ref 15–41)
Albumin: 4.5 g/dL (ref 3.5–5.0)
Alkaline Phosphatase: 81 U/L (ref 38–126)
Bilirubin, Direct: 0.2 mg/dL (ref 0.0–0.2)
Indirect Bilirubin: 0.3 mg/dL (ref 0.3–0.9)
Total Bilirubin: 0.4 mg/dL (ref 0.0–1.2)
Total Protein: 7.1 g/dL (ref 6.5–8.1)

## 2024-04-02 LAB — LIPASE, BLOOD: Lipase: 27 U/L (ref 11–51)

## 2024-04-02 LAB — TROPONIN T, HIGH SENSITIVITY
Troponin T High Sensitivity: <15 ng/L (ref 0–19)
Troponin T High Sensitivity: <15 ng/L (ref 0–19)

## 2024-04-02 MED ORDER — OMEPRAZOLE MAGNESIUM 20 MG PO TBEC: 20.0000 mg | DELAYED_RELEASE_TABLET | Freq: Two times a day (BID) | ORAL | 0 refills | Status: AC

## 2024-04-02 NOTE — ED Provider Notes (Signed)
 " [place] EMERGENCY DEPARTMENT AT [facility] Provider Note  CSN: [id number] Arrival date & time: 04/02/24 1635  Chief Complaint(s) Chest Pain  HPI Logan Mendoza is a 73 y.o. male with past medical history as below, significant for lymphoma, A-fib, hypertension who presents to the ED with complaint of epig pain, belching  Patient reports he has been having indigestion over the past few months, he started on Prilosec  and it seems to have improved his symptoms.  Has been Prilosec  for few months now.  He has been having some ongoing episodes of intermittent belching which also has been causing some midsternal discomfort.  Last episode was earlier today, he is currently symptomatic.  He has not seen gastroenterology.  Denies any vomiting or nausea, no blood in stool or melanotic stool, no change with urination, no significant abdominal pain.  No recent diet changes.  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 Authorizing Provider  omeprazole  (PRILOSEC  OTC) 20 MG tablet Take 1 tablet (20 mg total) by mouth in the morning and at bedtime. 04/02/24 05/02/24 Yes Elnor Jayson LABOR, DO  aspirin  EC 81 MG tablet Take 81 mg by mouth.    [provider]  B-D 3CC LUER-LOK SYR 25GX1 25G X 1 3 ML  MISC See admin instructions. 01/05/19   [provider]  Cholecalciferol 25 MCG (1000 UT) tablet Take 2,000 Units by mouth 3 (three) times daily.     [provider]  co-enzyme Q-10 30 MG capsule Take 200 mg by mouth daily.     [provider]  finasteride (PROSCAR) 5 MG tablet Take 5 mg by mouth daily.    [provider]  GLUCOSAMINE HCL PO Take 1 tablet by mouth daily.    [provider]  latanoprost (XALATAN) 0.005 % ophthalmic solution Place 1 drop into both eyes at bedtime. 09/07/19   [provider]  lisinopril  (ZESTRIL ) 10 MG tablet Take 1 tablet (10 mg total) by mouth daily. 07/22/23   Yolande Lamar BROCKS, MD  Milk Thistle 140 MG CAPS Take 140 mg by mouth daily.     [provider]  NIFEdipine (PROCARDIA-XL/ADALAT-CC/NIFEDICAL-XL) 30 MG 24 hr tablet Take 30 mg by mouth.    [provider]  Plant Sterols and Stanols (CHOLEST OFF PO) Take 300 mg by mouth daily.    [provider]  Red Yeast Rice Extract (RED YEAST RICE PO) Take 600 mg by mouth 2 (two) times daily.    [provider]  saw palmetto 500 MG capsule Take 500 mg by mouth daily.     [provider]  testosterone cypionate (DEPOTESTOSTERONE CYPIONATE) 200 MG/ML injection SMARTSIG:0.4 Milliliter(s) IM Every 2 Weeks 03/13/19   [provider]  testosterone cypionate (DEPOTESTOTERONE CYPIONATE) 100 MG/ML injection Inject  100 mg into the muscle every 14 (fourteen) days.    [provider]                                                                                                                                    Past Surgical History Past Surgical History:  Procedure Laterality Date   CYSTOSCOPY     HERNIA REPAIR     pyloric stenosis repair     age 19 yrs   Family History History reviewed. No pertinent family history.  Social History Social History[1] Allergies Sulfa antibiotics, Sucralfate, Sulfa drugs cross  reactors, and Sulfamethoxazole-trimethoprim  Review of Systems A thorough review of systems was obtained and all systems are negative except as noted in the HPI and PMH.   Physical Exam Vital Signs  I have reviewed the triage vital signs BP (!) 158/100   Pulse 81   Temp 97.8 F (36.6 C) (Oral)   Resp 18   SpO2 99%  Physical Exam Vitals and nursing note reviewed.  Constitutional:      General: He is not in acute distress.    Appearance: He is well-developed.  HENT:     Head: Normocephalic and atraumatic.     Right Ear: External ear normal.     Left Ear: External ear normal.     Mouth/Throat:     Mouth: Mucous membranes are moist.  Eyes:     General: No scleral icterus. Cardiovascular:     Rate and Rhythm: Normal rate and regular rhythm.     Pulses: Normal pulses.     Heart sounds: Normal heart sounds.  Pulmonary:     Effort: Pulmonary effort is normal. No respiratory distress.     Breath sounds: Normal breath sounds.  Abdominal:     General: Abdomen is flat.     Palpations: Abdomen is soft.     Tenderness: There is no abdominal tenderness.  Musculoskeletal:     Cervical back: No rigidity.     Right lower leg: No edema.     Left lower leg: No edema.  Skin:    General: Skin is warm and dry.     Capillary Refill: Capillary refill takes less than 2 seconds.  Neurological:     Mental Status: He is alert.  Psychiatric:        Mood and Affect: Mood normal.        Behavior: Behavior normal.     ED Results and Treatments Labs (all labs ordered are listed, but only abnormal results are displayed) Labs Reviewed  BASIC METABOLIC PANEL WITH GFR - Abnormal; Notable for the following components:      Result Value   Sodium 134 (*)    Chloride 95 (*)    All other components within normal limits  CBC  HEPATIC FUNCTION PANEL  LIPASE, BLOOD  TROPONIN T, HIGH SENSITIVITY  TROPONIN T, HIGH SENSITIVITY  Radiology DG Chest 2 View Result Date: 04/02/2024 CLINICAL DATA:  Acute onset chest pain EXAM: CHEST - 2 VIEW COMPARISON:  06/08/2021 FINDINGS: The heart size and mediastinal contours are within normal limits. Both lungs are clear. The visualized skeletal structures are unremarkable. IMPRESSION: No active cardiopulmonary disease. Electronically Signed   By: Luke Bun M.D.   On: 04/02/2024 17:22    Pertinent labs & imaging results that were available during my care of the patient were reviewed by me and considered in my medical decision making (see MDM for details).  Medications Ordered in ED Medications - No data to display                                                                                                                                   Procedures Procedures  (including critical care time)  Medical Decision Making / ED Course    Medical Decision Making:    Logan Mendoza is a 73 y.o. male with past medical history as below, significant for lymphoma, A-fib, hypertension who presents to the ED with complaint of epig pain, belching. The complaint involves an extensive differential diagnosis and also carries with it a high risk of complications and morbidity.  Serious etiology was considered. Ddx includes but is not limited to: Differential includes all life-threatening causes for chest pain. This includes but is not exclusive to acute coronary syndrome, aortic dissection, pulmonary embolism, cardiac tamponade, community-acquired pneumonia, pericarditis, musculoskeletal chest wall pain, etc.   Complete initial physical exam performed, notably the patient was in no acute distress, currently asymptomatic.    Reviewed and confirmed nursing documentation for past medical history, family history, social history.  Vital signs reviewed.    Atypical chest pain Belching> - ongoing epigastric / mid sternal discomfort, worse after  po - No dyspnea, no current chest pain, no palpitations, no syncope or near syncope, no abdominal pain, no evidence of bleeding - Labs are stable - Chest x-ray is stable - EKG nonischemic - Asymptomatic at this time - Echocardiogram 9/16, LVEF 50 to 55% - HEART score is low - Symptoms today favored to be likely GI in origin, atypical chest pain, unlikely ACS at this time. Low suspicion PE (well's score is low) - He has allergy Carafate, will increase his Prilosec , discussed strict dietary precautions, will give GI follow-up.  Likely benefit from endoscopy  The patient's chest pain is not suggestive of pulmonary embolus, cardiac ischemia, aortic dissection, pericarditis, myocarditis, pulmonary embolism, pneumothorax, pneumonia, Zoster, or esophageal perforation, or other serious etiology.  Historically not abrupt in onset, tearing or ripping, pulses symmetric. EKG nonspecific for ischemia/infarction. No dysrhythmias, brugada, WPW, prolonged QT noted.   Troponin negative x2. CXR reviewed. Labs without demonstration of acute pathology unless otherwise noted above. Low HEART Score: 0-3 points (0.9-1.7% risk of MACE).  Given the extremely low risk of these diagnoses further testing and evaluation for these possibilities does not appear  to be indicated at this time. Patient in no distress and overall condition improved here in the ED. Detailed discussions were had with the patient regarding current findings, and need for close f/u with PCP or on call doctor. The patient has been instructed to return immediately if the symptoms worsen in any way for re-evaluation. Patient verbalized understanding and is in agreement with current care plan. All questions answered prior to discharge.                        Additional history obtained: -Additional history obtained from spouse -External records from outside source obtained and reviewed including: Chart review including previous notes,  labs, imaging, consultation notes including  Medications, allergies, recent urgent care documentation   Lab Tests: -I ordered, reviewed, and interpreted labs.   The pertinent results include:   Labs Reviewed  BASIC METABOLIC PANEL WITH GFR - Abnormal; Notable for the following components:      Result Value   Sodium 134 (*)    Chloride 95 (*)    All other components within normal limits  CBC  HEPATIC FUNCTION PANEL  LIPASE, BLOOD  TROPONIN T, HIGH SENSITIVITY  TROPONIN T, HIGH SENSITIVITY    Notable for labs are stable  EKG   EKG Interpretation Date/Time:  Sunday April 02 2024 16:48:25 EST Ventricular Rate:  79 PR Interval:  175 QRS Duration:  90 QT Interval:  367 QTC Calculation: 421 R Axis:   63  Text Interpretation: Sinus rhythm Confirmed by Elnor Savant ([id number]) on 04/02/2024 6:12:15 PM         Imaging Studies ordered: I ordered imaging studies including cxr I independently visualized the following imaging with scope of interpretation limited to determining acute life threatening conditions related to emergency care; findings noted above I agree with the radiologist interpretation If any imaging was obtained with contrast I closely monitored patient for any possible adverse reaction a/w contrast administration in the emergency department   Medicines ordered and prescription drug management: Meds ordered this encounter  Medications   omeprazole  (PRILOSEC  OTC) 20 MG tablet    Sig: Take 1 tablet (20 mg total) by mouth in the morning and at bedtime.    Dispense:  60 tablet    Refill:  0    -I have reviewed the patients home medicines and have made adjustments as needed   Consultations Obtained: na   Cardiac Monitoring: The patient was maintained on a cardiac monitor.  I personally viewed and interpreted the cardiac monitored which showed an underlying rhythm of: nsr Continuous pulse oximetry interpreted by myself, 99% on RA.    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    Final Clinical Impression(s) / ED Diagnoses Final diagnoses:  Atypical chest pain  Indigestion         [  1]  Social History Tobacco Use   Smoking status: Never   Smokeless tobacco: Never  Vaping Use   Vaping status: Never Used  Substance Use Topics   Alcohol use: Yes    Alcohol/week: 1.0 standard drink of alcohol    Types: 1 Glasses of wine per week   Drug use: No     Elnor Jayson LABOR, DO 04/02/24 1948  "

## 2024-04-02 NOTE — Discharge Instructions (Signed)
 It was a pleasure caring for you today in the emergency department.   Return to the Emergency Department if you have unusual chest pain, pressure, or discomfort, shortness of breath, nausea, vomiting, burping, heartburn, tingling upper body parts, sweating, cold, clammy skin, or racing heartbeat. Call 911 if you think you are having a heart attack. Take all medications as prescribed - notify your doctor if you have any side effects. Follow cardiac diet - avoid fatty & fried foods, don't eat too much red meat, eat lots of fruits & vegetables, and dairy products should be low fat.  Avoid acidic foods such as marinara sauce, avoid NSAIDs such as Aleve or Goody's powder.  Avoid tobacco and alcohol.  Please lose weight if you are overweight. Become more active with walking, gardening, or any other activity that gets you to moving.   Please call gastroenterology for outpatient follow-up in the office, you would likely benefit from an endoscopy   Please return to the emergency department immediately for any new or concerning symptoms, or if you get worse.

## 2024-04-02 NOTE — ED Triage Notes (Signed)
 Mid chest pain, belching since this afternoon   Denies [facility], [place]
